# Patient Record
Sex: Female | Born: 1955
Health system: Southern US, Community
[De-identification: ages and names within clinical notes are randomized; demographics above are authoritative.]

## PROBLEM LIST (undated history)

## (undated) DIAGNOSIS — N950 Postmenopausal bleeding: Secondary | ICD-10-CM

## (undated) DIAGNOSIS — F329 Major depressive disorder, single episode, unspecified: Secondary | ICD-10-CM

## (undated) DIAGNOSIS — Z973 Presence of spectacles and contact lenses: Secondary | ICD-10-CM

## (undated) DIAGNOSIS — K759 Inflammatory liver disease, unspecified: Secondary | ICD-10-CM

## (undated) DIAGNOSIS — M1990 Unspecified osteoarthritis, unspecified site: Secondary | ICD-10-CM

## (undated) DIAGNOSIS — F419 Anxiety disorder, unspecified: Secondary | ICD-10-CM

## (undated) DIAGNOSIS — Z87820 Personal history of traumatic brain injury: Secondary | ICD-10-CM

## (undated) DIAGNOSIS — F32A Depression, unspecified: Secondary | ICD-10-CM

## (undated) DIAGNOSIS — D680 Von Willebrand's disease: Secondary | ICD-10-CM

## (undated) DIAGNOSIS — D684 Acquired coagulation factor deficiency: Secondary | ICD-10-CM

## (undated) DIAGNOSIS — R9389 Abnormal findings on diagnostic imaging of other specified body structures: Secondary | ICD-10-CM

## (undated) DIAGNOSIS — T8859XA Other complications of anesthesia, initial encounter: Secondary | ICD-10-CM

## (undated) DIAGNOSIS — R350 Frequency of micturition: Secondary | ICD-10-CM

## (undated) DIAGNOSIS — Z87898 Personal history of other specified conditions: Secondary | ICD-10-CM

## (undated) HISTORY — PX: HYSTEROSCOPY WITH D & C: SHX1775

## (undated) HISTORY — DX: Anxiety disorder, unspecified: F41.9

## (undated) HISTORY — PX: CERVICAL POLYPECTOMY: SHX88

## (undated) HISTORY — PX: COLONOSCOPY: SHX174

## (undated) HISTORY — PX: DILATION AND CURETTAGE OF UTERUS: SHX78

## (undated) HISTORY — PX: NASAL FRACTURE SURGERY: SHX718

## (undated) HISTORY — PX: DIAGNOSTIC LAPAROSCOPY: SUR761

## (undated) HISTORY — PX: ABDOMINAL HYSTERECTOMY: SHX81

## (undated) HISTORY — DX: Postmenopausal bleeding: N95.0

---

## 1998-09-04 ENCOUNTER — Other Ambulatory Visit: Admission: RE | Admit: 1998-09-04 | Discharge: 1998-09-04 | Payer: Self-pay | Admitting: *Deleted

## 1999-01-09 ENCOUNTER — Other Ambulatory Visit: Admission: RE | Admit: 1999-01-09 | Discharge: 1999-01-09 | Payer: Self-pay | Admitting: *Deleted

## 1999-12-30 ENCOUNTER — Encounter: Admission: RE | Admit: 1999-12-30 | Discharge: 1999-12-30 | Payer: Self-pay | Admitting: *Deleted

## 2000-02-29 ENCOUNTER — Ambulatory Visit (HOSPITAL_COMMUNITY): Admission: RE | Admit: 2000-02-29 | Discharge: 2000-02-29 | Payer: Self-pay | Admitting: *Deleted

## 2000-08-11 ENCOUNTER — Other Ambulatory Visit: Admission: RE | Admit: 2000-08-11 | Discharge: 2000-08-11 | Payer: Self-pay | Admitting: Obstetrics & Gynecology

## 2000-08-12 ENCOUNTER — Other Ambulatory Visit: Admission: RE | Admit: 2000-08-12 | Discharge: 2000-08-12 | Payer: Self-pay | Admitting: Obstetrics & Gynecology

## 2000-10-21 ENCOUNTER — Other Ambulatory Visit: Admission: RE | Admit: 2000-10-21 | Discharge: 2000-10-21 | Payer: Self-pay | Admitting: *Deleted

## 2001-05-01 ENCOUNTER — Other Ambulatory Visit: Admission: RE | Admit: 2001-05-01 | Discharge: 2001-05-01 | Payer: Self-pay | Admitting: Obstetrics and Gynecology

## 2001-10-17 ENCOUNTER — Other Ambulatory Visit: Admission: RE | Admit: 2001-10-17 | Discharge: 2001-10-17 | Payer: Self-pay | Admitting: *Deleted

## 2011-01-11 ENCOUNTER — Encounter: Payer: Self-pay | Admitting: Obstetrics and Gynecology

## 2011-07-30 DIAGNOSIS — H109 Unspecified conjunctivitis: Secondary | ICD-10-CM | POA: Insufficient documentation

## 2011-07-30 DIAGNOSIS — H5710 Ocular pain, unspecified eye: Secondary | ICD-10-CM | POA: Insufficient documentation

## 2011-07-31 ENCOUNTER — Encounter: Payer: Self-pay | Admitting: *Deleted

## 2011-07-31 ENCOUNTER — Emergency Department (HOSPITAL_BASED_OUTPATIENT_CLINIC_OR_DEPARTMENT_OTHER)
Admission: EM | Admit: 2011-07-31 | Discharge: 2011-07-31 | Disposition: A | Discharge status: Home / Self Care | Payer: 59 | Attending: Emergency Medicine | Admitting: Emergency Medicine

## 2011-07-31 DIAGNOSIS — H109 Unspecified conjunctivitis: Secondary | ICD-10-CM

## 2011-07-31 HISTORY — DX: Von Willebrand's disease: D68.0

## 2011-07-31 HISTORY — DX: Depression, unspecified: F32.A

## 2011-07-31 HISTORY — DX: Major depressive disorder, single episode, unspecified: F32.9

## 2011-07-31 MED ORDER — TOBRAMYCIN 0.3 % OP SOLN
2.0000 [drp] | Freq: Four times a day (QID) | OPHTHALMIC | Status: DC
Start: 2011-07-31 — End: 2011-07-31
  Filled 2011-07-31: qty 5

## 2011-07-31 MED ORDER — TOBRAMYCIN 0.3 % OP SOLN: 2.0000 [drp] | Freq: Four times a day (QID) | OPHTHALMIC | Status: AC

## 2011-07-31 NOTE — ED Provider Notes (Signed)
History     CSN: [id number] Arrival date & time: 07/31/2011 12:42 AM  Chief Complaint  Patient presents with  . Eye Pain   HPI Patient awakened morning of 07/30/2011 with bilateral eye redness and clear drainage from both eyes and vague feeling of discomfort in both eyes no change in vision no other complaint no treatment prior to coming here. Nothing makes symptoms better or worse  Past Medical History  Diagnosis Date  . Hemophilia, vascular   . Depression   . Von Willebrand's disease    Factor VIII deficiency Past Surgical History  Procedure Date  . Nasal fracture surgery   . Abdominal hysterectomy     No family history on file.  History  Substance Use Topics  . Smoking status: Never Smoker   . Smokeless tobacco: Not on file  . Alcohol Use: No    OB History    Grav Para Term Preterm Abortions TAB SAB Ect Mult Living                  Review of Systems  Constitutional: Negative.   HENT: Negative.   Eyes: Positive for discharge and redness. Negative for visual disturbance.  Neurological: Negative.   Psychiatric/Behavioral: Negative.     Physical Exam  BP 136/77  Pulse 70  Temp(Src) 97.9 F (36.6 C) (Oral)  Resp 18  Physical Exam  Constitutional: She appears well-developed and well-nourished.  HENT:  Head: Normocephalic and atraumatic.  Eyes: Pupils are equal, round, and reactive to light.       Sub-conjunctival injection bilaterally bilateral lids slightly swollen and reddened  Neck: Neck supple. No tracheal deviation present. No thyromegaly present.  Cardiovascular: Normal rate.   No murmur heard. Pulmonary/Chest: Effort normal.  Abdominal: She exhibits no distension.  Musculoskeletal: Normal range of motion. She exhibits no edema and no tenderness.  Neurological: She is alert. Coordination normal.  Skin: Skin is warm and dry. No rash noted.  Psychiatric: She has a normal mood and affect.    ED Course  Procedures  MDM    Assessment  conjunctivitis plan prescription for tobramycin eye drops. Followup [facility] if not improved 2 days  Doug Sou, MD 07/31/11 0147

## 2011-07-31 NOTE — ED Notes (Signed)
Pt awoke on Friday am with bilat eye redness and irritation. Pt reports no relief with OTC eye drops.

## 2012-07-21 ENCOUNTER — Other Ambulatory Visit: Payer: Self-pay | Admitting: Gastroenterology

## 2012-07-21 DIAGNOSIS — R198 Other specified symptoms and signs involving the digestive system and abdomen: Secondary | ICD-10-CM

## 2012-08-07 ENCOUNTER — Ambulatory Visit
Admission: RE | Admit: 2012-08-07 | Discharge: 2012-08-07 | Disposition: A | Discharge status: Home / Self Care | Payer: 59 | Source: Ambulatory Visit | Attending: Gastroenterology | Admitting: Gastroenterology

## 2012-08-07 DIAGNOSIS — R198 Other specified symptoms and signs involving the digestive system and abdomen: Secondary | ICD-10-CM

## 2015-03-17 ENCOUNTER — Emergency Department (HOSPITAL_COMMUNITY)
Admission: EM | Admit: 2015-03-17 | Discharge: 2015-03-17 | Disposition: A | Discharge status: Home / Self Care | Payer: 59 | Attending: Emergency Medicine | Admitting: Emergency Medicine

## 2015-03-17 ENCOUNTER — Encounter (HOSPITAL_COMMUNITY): Payer: Self-pay | Admitting: Emergency Medicine

## 2015-03-17 ENCOUNTER — Emergency Department (HOSPITAL_COMMUNITY): Payer: 59

## 2015-03-17 DIAGNOSIS — F329 Major depressive disorder, single episode, unspecified: Secondary | ICD-10-CM | POA: Diagnosis not present

## 2015-03-17 DIAGNOSIS — R52 Pain, unspecified: Secondary | ICD-10-CM

## 2015-03-17 DIAGNOSIS — Z79899 Other long term (current) drug therapy: Secondary | ICD-10-CM | POA: Insufficient documentation

## 2015-03-17 DIAGNOSIS — Z862 Personal history of diseases of the blood and blood-forming organs and certain disorders involving the immune mechanism: Secondary | ICD-10-CM | POA: Insufficient documentation

## 2015-03-17 DIAGNOSIS — R079 Chest pain, unspecified: Secondary | ICD-10-CM | POA: Diagnosis not present

## 2015-03-17 DIAGNOSIS — R109 Unspecified abdominal pain: Secondary | ICD-10-CM | POA: Insufficient documentation

## 2015-03-17 DIAGNOSIS — Z90710 Acquired absence of both cervix and uterus: Secondary | ICD-10-CM | POA: Diagnosis not present

## 2015-03-17 DIAGNOSIS — R0789 Other chest pain: Secondary | ICD-10-CM

## 2015-03-17 LAB — CBC WITH DIFFERENTIAL/PLATELET
Basophils Absolute: 0 10*3/uL (ref 0.0–0.1)
Basophils Relative: 0 % (ref 0–1)
Eosinophils Absolute: 0.2 10*3/uL (ref 0.0–0.7)
Eosinophils Relative: 3 % (ref 0–5)
HCT: 37.4 % (ref 36.0–46.0)
Hemoglobin: 11.8 g/dL — ABNORMAL LOW (ref 12.0–15.0)
Lymphocytes Relative: 22 % (ref 12–46)
Lymphs Abs: 1.7 10*3/uL (ref 0.7–4.0)
MCH: 26.1 pg (ref 26.0–34.0)
MCHC: 31.6 g/dL (ref 30.0–36.0)
MCV: 82.7 fL (ref 78.0–100.0)
Monocytes Absolute: 0.7 10*3/uL (ref 0.1–1.0)
Monocytes Relative: 9 % (ref 3–12)
Neutro Abs: 5.2 10*3/uL (ref 1.7–7.7)
Neutrophils Relative %: 66 % (ref 43–77)
Platelets: 360 10*3/uL (ref 150–400)
RBC: 4.52 MIL/uL (ref 3.87–5.11)
RDW: 13.9 % (ref 11.5–15.5)
WBC: 7.8 10*3/uL (ref 4.0–10.5)

## 2015-03-17 LAB — COMPREHENSIVE METABOLIC PANEL
ALT: 25 U/L (ref 0–35)
AST: 26 U/L (ref 0–37)
Albumin: 4.2 g/dL (ref 3.5–5.2)
Alkaline Phosphatase: 85 U/L (ref 39–117)
Anion gap: 9 (ref 5–15)
BUN: 25 mg/dL — ABNORMAL HIGH (ref 6–23)
CO2: 26 mmol/L (ref 19–32)
Calcium: 9.4 mg/dL (ref 8.4–10.5)
Chloride: 104 mmol/L (ref 96–112)
Creatinine, Ser: 0.56 mg/dL (ref 0.50–1.10)
GFR calc Af Amer: >90 mL/min (ref >90)
GFR calc non Af Amer: >90 mL/min (ref >90)
Glucose, Bld: 98 mg/dL (ref 70–99)
Potassium: 3.9 mmol/L (ref 3.5–5.1)
Sodium: 139 mmol/L (ref 135–145)
Total Bilirubin: 0.4 mg/dL (ref 0.3–1.2)
Total Protein: 7.6 g/dL (ref 6.0–8.3)

## 2015-03-17 LAB — LIPASE, BLOOD: Lipase: 22 U/L (ref 11–59)

## 2015-03-17 LAB — I-STAT TROPONIN, ED: Troponin i, poc: 0.02 ng/mL (ref 0.00–0.08)

## 2015-03-17 MED ORDER — SODIUM CHLORIDE 0.9 % IV BOLUS (SEPSIS)
1000.0000 mL | Freq: Once | INTRAVENOUS | Status: AC
  Administered 2015-03-17: 1000 mL via INTRAVENOUS

## 2015-03-17 NOTE — ED Notes (Signed)
Pt was on sofa not doing anything when she had an episode of diaphoresis with severe jaw pain and this was followed by multiple episodes of vomiting.  Pt then had difficulty sleeping and saw her PCP today and was told to come be evaluated to make sure she did not have a MI.

## 2015-03-17 NOTE — Discharge Instructions (Signed)
Follow up with [facility] in 1-2 weeks.  Return if needed

## 2015-03-17 NOTE — ED Notes (Signed)
Patient in xray I will perform labs when patient return.

## 2015-03-17 NOTE — ED Notes (Signed)
Pt states doctors office told pt to come over here because she mat have had a heart attack Saturday night. Pt now c/o upper abd pain and states she is dehydrated.

## 2015-03-17 NOTE — ED Provider Notes (Signed)
CSN: [id number]     Arrival date & time 03/17/15  1141 History   First MD Initiated Contact with Patient 03/17/15 1208     Chief Complaint  Patient presents with  . Dehydration  . Abdominal Pain     (Consider location/radiation/quality/duration/timing/severity/associated sxs/prior Treatment) Patient is a 59 y.o. female presenting with chest pain. The history is provided by the patient (the pt states she had 2 days ago neck pain and sob with sweating).  Chest Pain Chest pain location: neck pain. Pain quality: aching   Pain radiates to:  L jaw Pain radiates to the back: no   Pain severity:  Severe Onset quality:  Sudden Timing:  Rare Chronicity:  New Associated symptoms: no abdominal pain, no back pain, no cough, no fatigue and no headache     Past Medical History  Diagnosis Date  . Hemophilia, vascular   . Depression   . Von Willebrand's disease    Past Surgical History  Procedure Laterality Date  . Nasal fracture surgery    . Abdominal hysterectomy     No family history on file. History  Substance Use Topics  . Smoking status: Never Smoker   . Smokeless tobacco: Not on file  . Alcohol Use: No   OB History    No data available     Review of Systems  Constitutional: Negative for appetite change and fatigue.  HENT: Negative for congestion, ear discharge and sinus pressure.   Eyes: Negative for discharge.  Respiratory: Negative for cough.   Cardiovascular: Positive for chest pain.  Gastrointestinal: Negative for abdominal pain and diarrhea.  Genitourinary: Negative for frequency and hematuria.  Musculoskeletal: Negative for back pain.  Skin: Negative for rash.  Neurological: Negative for seizures and headaches.  Psychiatric/Behavioral: Negative for hallucinations.      Allergies  Septra; Adhesive; and Tamiflu  Home Medications   Prior to Admission medications   Medication Sig Start Date End Date Taking? Authorizing Provider  acetaminophen (TYLENOL) 500  MG tablet Take 500-1,000 mg by mouth every 6 (six) hours as needed for mild pain, moderate pain, fever or headache.   Yes Historical Provider, MD  calcium-vitamin D (OSCAL WITH D) 500-200 MG-UNIT per tablet Take 1 tablet by mouth 2 (two) times daily.   Yes Historical Provider, MD  Triprolidine-Pseudoephedrine (ANTIHISTAMINE PO) Take 1 tablet by mouth daily as needed (for allergies).   Yes Historical Provider, MD  venlafaxine XR (EFFEXOR-XR) 37.5 MG 24 hr capsule Take 37.5 mg by mouth at bedtime.   Yes Historical Provider, MD   BP 132/68 mmHg  Pulse 68  Temp(Src) 98.8 F (37.1 C) (Oral)  Resp 18  SpO2 98% Physical Exam  Constitutional: She is oriented to person, place, and time. She appears well-developed.  HENT:  Head: Normocephalic.  Eyes: Conjunctivae and EOM are normal. No scleral icterus.  Neck: Neck supple. No thyromegaly present.  Cardiovascular: Normal rate and regular rhythm.  Exam reveals no gallop and no friction rub.   No murmur heard. Pulmonary/Chest: No stridor. She has no wheezes. She has no rales. She exhibits no tenderness.  Abdominal: She exhibits no distension. There is no tenderness. There is no rebound.  Musculoskeletal: Normal range of motion. She exhibits no edema.  Lymphadenopathy:    She has no cervical adenopathy.  Neurological: She is oriented to person, place, and time. She exhibits normal muscle tone. Coordination normal.  Skin: No rash noted. No erythema.  Psychiatric: She has a normal mood and affect. Her behavior is normal.  ED Course  Procedures (including critical care time) Labs Review Labs Reviewed  CBC WITH DIFFERENTIAL/PLATELET - Abnormal; Notable for the following:    Hemoglobin 11.8 (*)    All other components within normal limits  COMPREHENSIVE METABOLIC PANEL - Abnormal; Notable for the following:    BUN 25 (*)    All other components within normal limits  LIPASE, BLOOD  I-STAT TROPOININ, ED    Imaging Review Dg Abd Acute  W/chest  03/17/2015   CLINICAL DATA:  Upper abdominal pain beginning 2 days ago with associated vomiting and fevers.  EXAM: ACUTE ABDOMEN SERIES (ABDOMEN 2 VIEW & CHEST 1 VIEW)  COMPARISON:  CT virtual colonoscopy 08/07/2012  FINDINGS: The cardiomediastinal silhouette is within normal limits. The lungs are well inflated and clear. No pleural effusion or pneumothorax is identified.  There is no evidence of intraperitoneal free air. No bowel air-fluid levels are seen. Gas is present in loops of nondilated small and large bowel without evidence of obstruction. There is a small amount of stool throughout the colon. No abnormal calcification is seen. No acute osseous abnormality is identified.  IMPRESSION: Negative abdominal radiographs.  No acute cardiopulmonary disease.   Electronically Signed   By: Sebastian Ache   On: 03/17/2015 13:05     EKG Interpretation   Date/Time:  Monday March 17 2015 11:54:25 EDT Ventricular Rate:  80 PR Interval:  128 QRS Duration: 85 QT Interval:  372 QTC Calculation: 429 R Axis:   61 Text Interpretation:  Sinus rhythm Confirmed by Palmyra Rogacki  MD, Ever Gustafson [phone])  on 03/17/2015 12:17:53 PM      MDM   Final diagnoses:  Pain  Chest pain of uncertain etiology    Chest pain 2 days ago,,  Nl studies, will refer to cards for possible stress test    Bethann Berkshire, MD 03/17/15 1454

## 2015-04-21 ENCOUNTER — Ambulatory Visit: Payer: 59 | Admitting: Internal Medicine

## 2015-05-21 ENCOUNTER — Encounter: Payer: Self-pay | Admitting: Internal Medicine

## 2015-05-21 ENCOUNTER — Ambulatory Visit (INDEPENDENT_AMBULATORY_CARE_PROVIDER_SITE_OTHER): Payer: 59 | Admitting: Internal Medicine

## 2015-05-21 VITALS — BP 124/66 | HR 72 | Ht 64.0 in | Wt 205.8 lb

## 2015-05-21 DIAGNOSIS — R079 Chest pain, unspecified: Secondary | ICD-10-CM

## 2015-05-21 DIAGNOSIS — Z8249 Family history of ischemic heart disease and other diseases of the circulatory system: Secondary | ICD-10-CM | POA: Insufficient documentation

## 2015-05-21 NOTE — Progress Notes (Signed)
OFFICE NOTE  Chief Complaint:  ER follow-up, chest pain  Primary Care Physician: Darrow BussingKOIRALA,DIBAS, MD  HPI:  Mary CurryJeanne Juarez is a pleasant 59 year old female who presents for follow-up from recent hospitalization the ER. She was seen there for what was thought to be chest pain although she denies any chest pain. Evette CristalShin fact presented with jaw pain and was vomiting. This apparently was just after being diagnosed with the flu and she was placed on Tamiflu. She felt that she even threw up the medicine. She's not had any further jaw pain since that episode, nor any chest pain or worsening fatigue. There is a strong family history of coronary disease in her brother and parents. She does not have any diagnosis of dyslipidemia or hypertension and is nondiabetic. She does have a significant psychiatric history including depression, catatonia, and history of anorexia. She has von Willebrand's disease as well.  PMHx:  Past Medical History  Diagnosis Date  . Hemophilia, vascular   . Depression   . Von Willebrand's disease     Past Surgical History  Procedure Laterality Date  . Nasal fracture surgery    . Abdominal hysterectomy      FAMHx:  Family History  Problem Relation Age of Onset  . Stroke Mother   . Heart disease Mother     pacemaker  . Heart attack Father   . Kidney failure Father   . Heart disease Maternal Grandmother     pacemaker  . Heart attack Maternal Grandfather   . Sudden death Maternal Grandfather   . Cancer Paternal Grandmother     oral   . Heart attack Paternal Grandfather   . Sudden death Paternal Grandfather   . Heart disease Brother     SOCHx:   reports that she has never smoked. She does not have any smokeless tobacco history on file. She reports that she does not drink alcohol or use illicit drugs.  ALLERGIES:  Allergies  Allergen Reactions  . Septra [Sulfamethoxazole-Trimethoprim] Other (See Comments)    Severe migraine   . Adhesive [Tape] Rash  .  Latex Rash  . Tamiflu [Oseltamivir] Nausea And Vomiting    ROS: A comprehensive review of systems was negative except for: Cardiovascular: positive for chest pain Musculoskeletal: positive for myalgias  HOME MEDS: Current Outpatient Prescriptions  Medication Sig Dispense Refill  . acetaminophen (TYLENOL) 500 MG tablet Take 500-1,000 mg by mouth every 6 (six) hours as needed for mild pain, moderate pain, fever or headache.    . calcium-vitamin D (OSCAL WITH D) 500-200 MG-UNIT per tablet Take 1 tablet by mouth 2 (two) times daily.    . clobetasol ointment (TEMOVATE) 0.05 % Apply topically. Use as directed    . Triprolidine-Pseudoephedrine (ANTIHISTAMINE PO) Take 1 tablet by mouth daily as needed (for allergies).    . venlafaxine XR (EFFEXOR-XR) 37.5 MG 24 hr capsule Take 37.5 mg by mouth at bedtime.     No current facility-administered medications for this visit.    LABS/IMAGING: No results found for this or any previous visit (from the past 48 hour(s)). No results found.  WEIGHTS: Wt Readings from Last 3 Encounters:  05/21/15 205 lb 12.8 oz (93.35 kg)    VITALS: BP 124/66 mmHg  Pulse 72  Ht 5\' 4"  (1.626 m)  Wt 205 lb 12.8 oz (93.35 kg)  BMI 35.31 kg/m2  LMP   EXAM: General appearance: alert and no distress Neck: no carotid bruit and no JVD Lungs: clear to auscultation bilaterally Heart: regular  rate and rhythm, S1, S2 normal, no murmur, click, rub or gallop Abdomen: soft, non-tender; bowel sounds normal; no masses,  no organomegaly Extremities: extremities normal, atraumatic, no cyanosis or edema Pulses: 2+ and symmetric Skin: Skin color, texture, turgor normal. No rashes or lesions Neurologic: Grossly normal Psych: Normal  EKG: Personally reviewed ER EKG which shows sinus rhythm, no ischemia  ASSESSMENT: 1. Atypical chest pain 2. Depression 3. Strong family history of coronary disease  PLAN: 1.   Mary Juarez is presenting for follow-up of what is described  as possible atypical chest pain although she vehemently denies any chest discomfort, she did report jaw pain which was very unusual. She denied any burning or reflux symptoms. She is known to have a hiatus hernia. Her workup in the ER was negative for ischemia and troponin was negative. This sounds like a typical pain for a cardiac etiology. That being said, given her family history and increased risk, I would be reasonable consider an exercise treadmill stress test. If negative, no further cardiac workup is necessary. We will request a lipid profile. It may be reasonable to consider treatment.  I'll contact her with results of her general stress test. Thanks again for the kind referral.  Chrystie Nose, MD, [facility] Attending Cardiologist [facility]  Chrystie Nose 05/21/2015, 3:21 PM

## 2015-05-21 NOTE — Patient Instructions (Addendum)
Your physician has requested that you have an exercise tolerance test. For further information please visit [email or web address]. Please also follow instruction sheet, as given.  Your physician recommends that you schedule a follow-up appointment as needed.   

## 2015-05-22 ENCOUNTER — Encounter: Payer: Self-pay | Admitting: Internal Medicine

## 2015-06-20 ENCOUNTER — Telehealth (HOSPITAL_COMMUNITY): Payer: Self-pay

## 2015-06-20 NOTE — Telephone Encounter (Signed)
Encounter complete. 

## 2015-06-25 ENCOUNTER — Ambulatory Visit (HOSPITAL_COMMUNITY)
Admission: RE | Admit: 2015-06-25 | Discharge: 2015-06-25 | Disposition: A | Discharge status: Home / Self Care | Payer: 59 | Source: Ambulatory Visit | Attending: Cardiovascular Disease | Admitting: Cardiovascular Disease

## 2015-06-25 DIAGNOSIS — R079 Chest pain, unspecified: Secondary | ICD-10-CM | POA: Diagnosis present

## 2015-06-25 DIAGNOSIS — Z8249 Family history of ischemic heart disease and other diseases of the circulatory system: Secondary | ICD-10-CM | POA: Diagnosis not present

## 2015-06-25 LAB — EXERCISE TOLERANCE TEST
Estimated workload: 10.1 METS
Exercise duration (min): 8 min
Exercise duration (sec): 6 s
MPHR: 160 {beats}/min
Peak HR: 142 {beats}/min
Percent HR: 88 %
RPE: 15
Rest HR: 80 {beats}/min

## 2015-06-30 ENCOUNTER — Telehealth: Payer: Self-pay | Admitting: Internal Medicine

## 2015-06-30 NOTE — Telephone Encounter (Signed)
Pt. Informed about her stress test 

## 2015-06-30 NOTE — Telephone Encounter (Signed)
Pt would like her stress test results from last week please.You can leave detailed message if she is not there.

## 2015-10-30 ENCOUNTER — Other Ambulatory Visit: Payer: Self-pay

## 2015-10-30 DIAGNOSIS — Z1231 Encounter for screening mammogram for malignant neoplasm of breast: Secondary | ICD-10-CM

## 2015-11-20 ENCOUNTER — Ambulatory Visit: Payer: 59

## 2016-07-01 ENCOUNTER — Other Ambulatory Visit: Payer: Self-pay | Admitting: Family Medicine

## 2016-07-01 ENCOUNTER — Other Ambulatory Visit: Payer: Self-pay | Admitting: Obstetrics and Gynecology

## 2016-07-01 DIAGNOSIS — Z1231 Encounter for screening mammogram for malignant neoplasm of breast: Secondary | ICD-10-CM

## 2016-07-06 ENCOUNTER — Ambulatory Visit
Admission: RE | Admit: 2016-07-06 | Discharge: 2016-07-06 | Disposition: A | Discharge status: Home / Self Care | Payer: BLUE CROSS/BLUE SHIELD | Source: Ambulatory Visit | Attending: Family Medicine | Admitting: Family Medicine

## 2016-07-06 DIAGNOSIS — Z1231 Encounter for screening mammogram for malignant neoplasm of breast: Secondary | ICD-10-CM

## 2018-12-26 ENCOUNTER — Other Ambulatory Visit: Payer: Self-pay | Admitting: Family Medicine

## 2018-12-26 DIAGNOSIS — Z8342 Family history of familial hypercholesterolemia: Secondary | ICD-10-CM | POA: Diagnosis not present

## 2018-12-26 DIAGNOSIS — Z1231 Encounter for screening mammogram for malignant neoplasm of breast: Secondary | ICD-10-CM

## 2018-12-26 DIAGNOSIS — Z13220 Encounter for screening for lipoid disorders: Secondary | ICD-10-CM | POA: Diagnosis not present

## 2018-12-26 DIAGNOSIS — Z0000 Encounter for general adult medical examination without abnormal findings: Secondary | ICD-10-CM | POA: Diagnosis not present

## 2019-01-04 DIAGNOSIS — B029 Zoster without complications: Secondary | ICD-10-CM | POA: Diagnosis not present

## 2019-01-04 DIAGNOSIS — G518 Other disorders of facial nerve: Secondary | ICD-10-CM | POA: Diagnosis not present

## 2019-01-05 DIAGNOSIS — B0239 Other herpes zoster eye disease: Secondary | ICD-10-CM | POA: Diagnosis not present

## 2019-01-09 DIAGNOSIS — B029 Zoster without complications: Secondary | ICD-10-CM | POA: Diagnosis not present

## 2019-01-25 ENCOUNTER — Ambulatory Visit
Admission: RE | Admit: 2019-01-25 | Discharge: 2019-01-25 | Disposition: A | Discharge status: Home / Self Care | Payer: 59 | Source: Ambulatory Visit | Attending: Family Medicine | Admitting: Family Medicine

## 2019-01-25 DIAGNOSIS — Z1231 Encounter for screening mammogram for malignant neoplasm of breast: Secondary | ICD-10-CM

## 2019-02-20 ENCOUNTER — Other Ambulatory Visit (HOSPITAL_COMMUNITY)
Admission: RE | Admit: 2019-02-20 | Discharge: 2019-02-20 | Disposition: A | Discharge status: Home / Self Care | Payer: 59 | Source: Ambulatory Visit | Attending: Obstetrics and Gynecology | Admitting: Obstetrics and Gynecology

## 2019-02-20 ENCOUNTER — Other Ambulatory Visit: Payer: Self-pay | Admitting: Obstetrics and Gynecology

## 2019-02-20 DIAGNOSIS — Z124 Encounter for screening for malignant neoplasm of cervix: Secondary | ICD-10-CM | POA: Insufficient documentation

## 2019-02-20 DIAGNOSIS — Z01411 Encounter for gynecological examination (general) (routine) with abnormal findings: Secondary | ICD-10-CM | POA: Diagnosis not present

## 2019-02-21 ENCOUNTER — Other Ambulatory Visit: Payer: Self-pay | Admitting: Obstetrics and Gynecology

## 2019-02-21 DIAGNOSIS — M8580 Other specified disorders of bone density and structure, unspecified site: Secondary | ICD-10-CM

## 2019-02-22 LAB — CYTOLOGY - PAP
Diagnosis: NEGATIVE
HPV: NOT DETECTED

## 2019-05-28 ENCOUNTER — Ambulatory Visit
Admission: RE | Admit: 2019-05-28 | Discharge: 2019-05-28 | Disposition: A | Discharge status: Home / Self Care | Payer: 59 | Source: Ambulatory Visit | Attending: Obstetrics and Gynecology | Admitting: Obstetrics and Gynecology

## 2019-05-28 ENCOUNTER — Other Ambulatory Visit: Payer: Self-pay

## 2019-05-28 DIAGNOSIS — M8580 Other specified disorders of bone density and structure, unspecified site: Secondary | ICD-10-CM

## 2019-06-19 IMAGING — MG DIGITAL SCREENING BILATERAL MAMMOGRAM WITH TOMO AND CAD
8 series · 9 of 24 positions shown · non-contrast
Comparison: Previous exam(s).

CLINICAL DATA: Screening.

EXAM:
DIGITAL SCREENING BILATERAL MAMMOGRAM WITH TOMO AND CAD

[L MLO synth-2D]
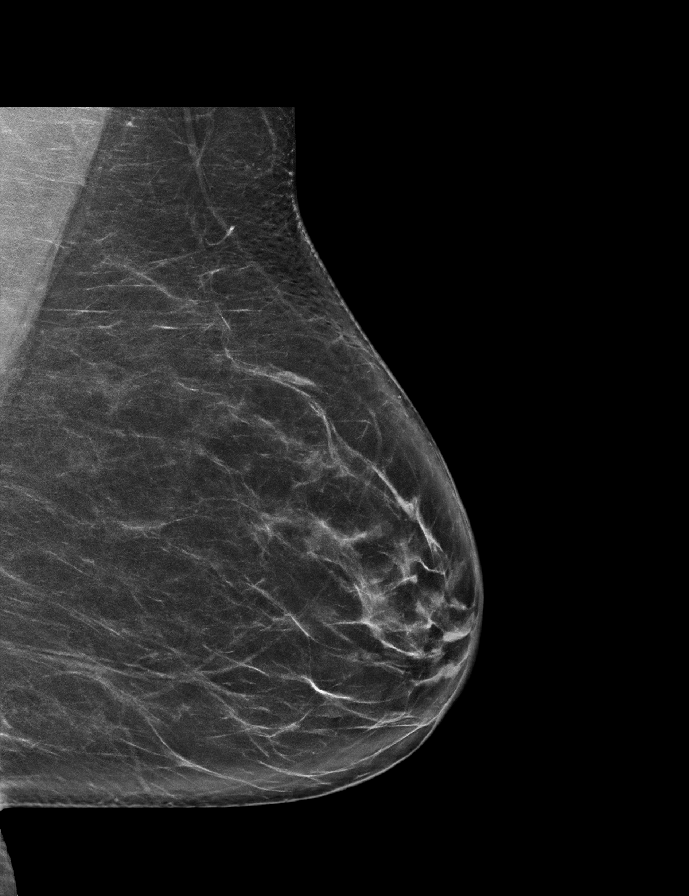

[L CC synth-2D]
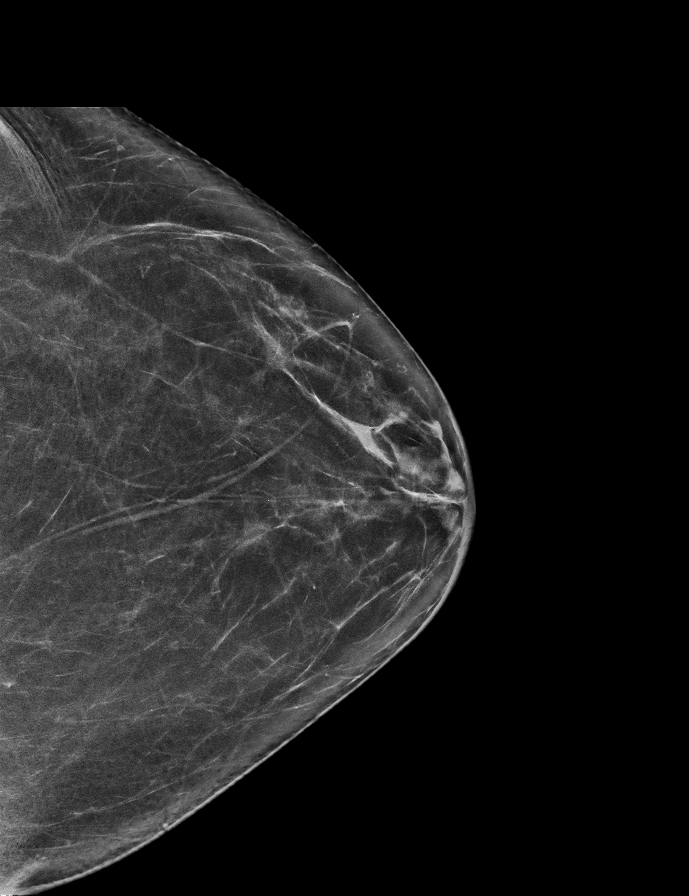

[R MLO synth-2D]
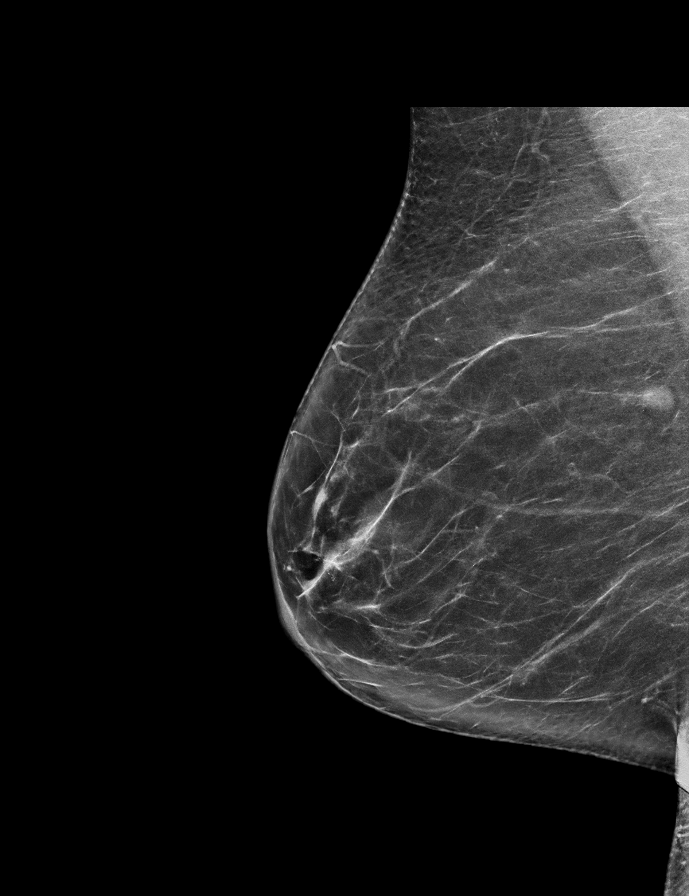

[R CC synth-2D]
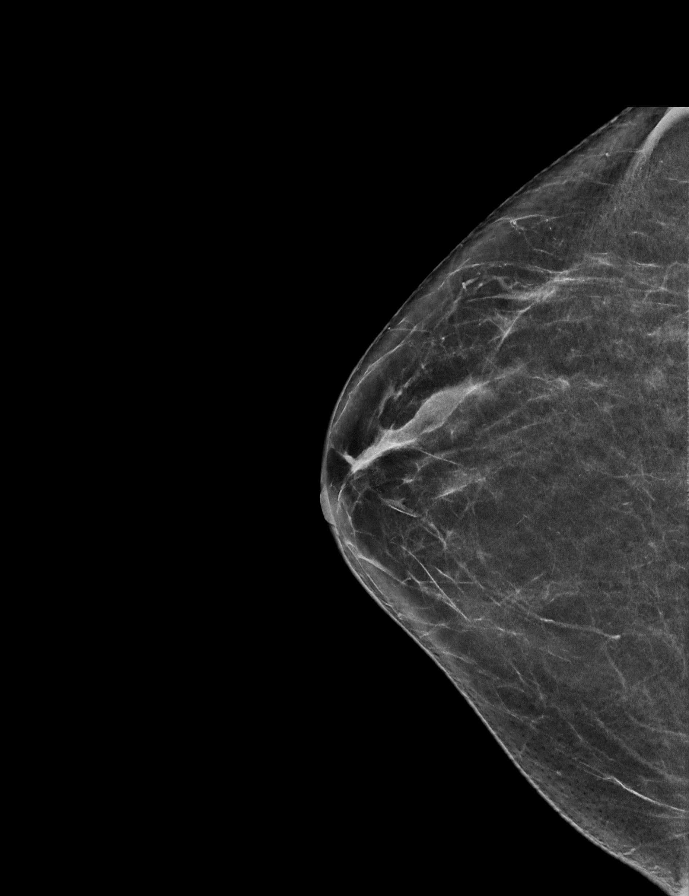

[L CC tomo · 2 of 66 frames shown]
[frame 22/66]
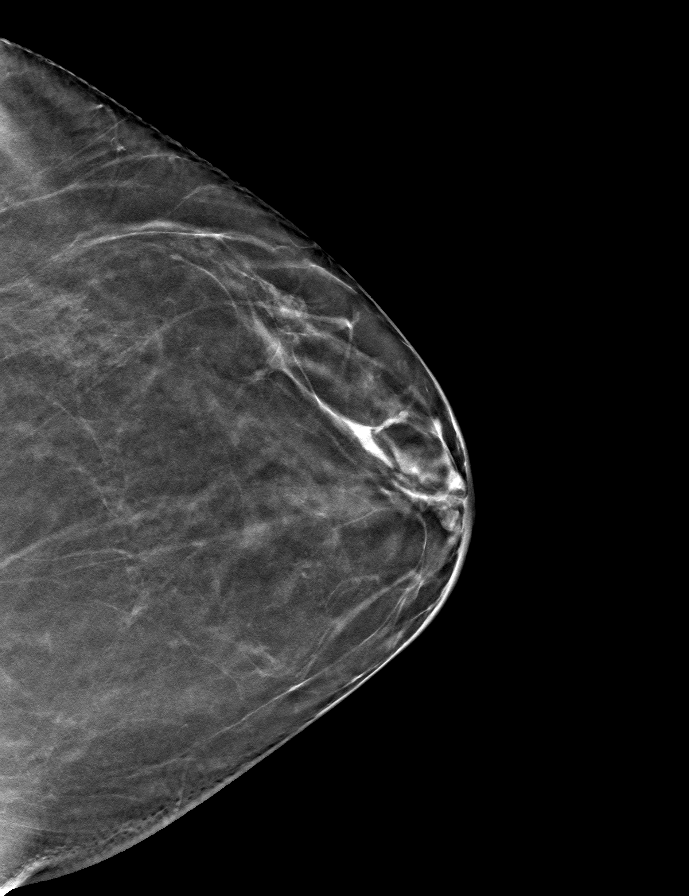
[frame 33/66]
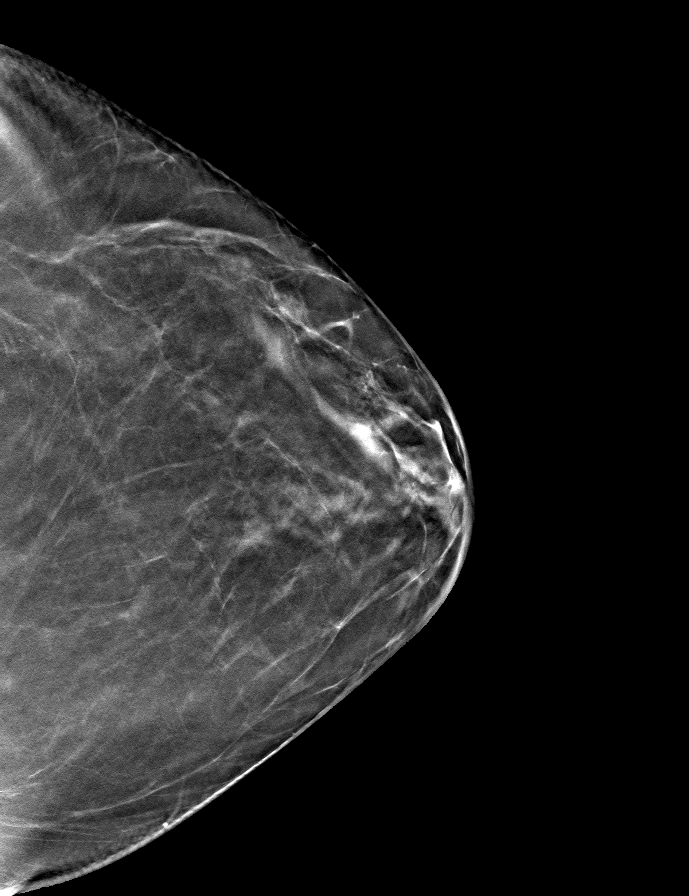

[R MLO tomo · tomo slice 37/73.0]
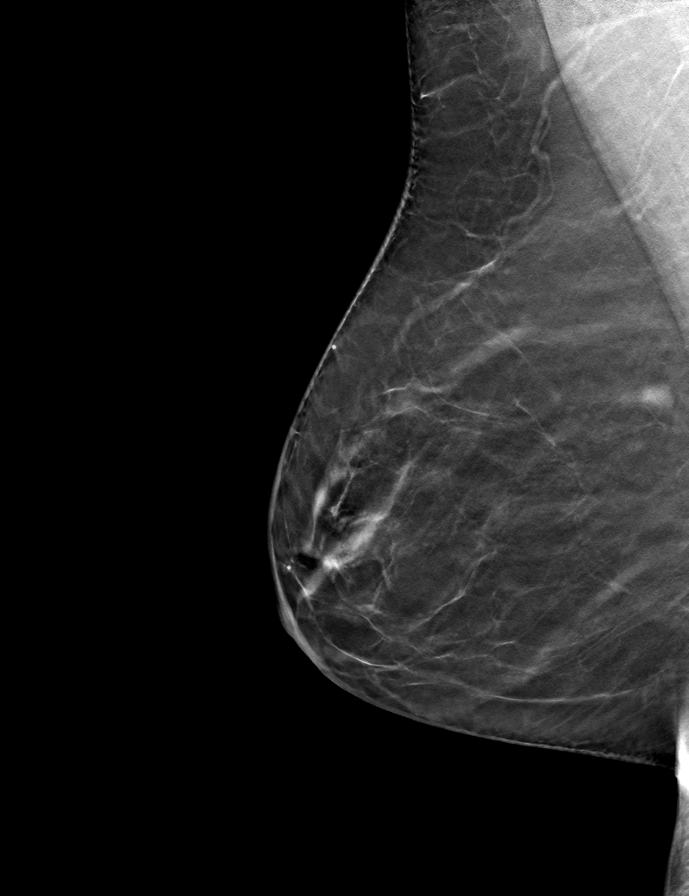

[L MLO tomo · tomo slice 37/73.0]
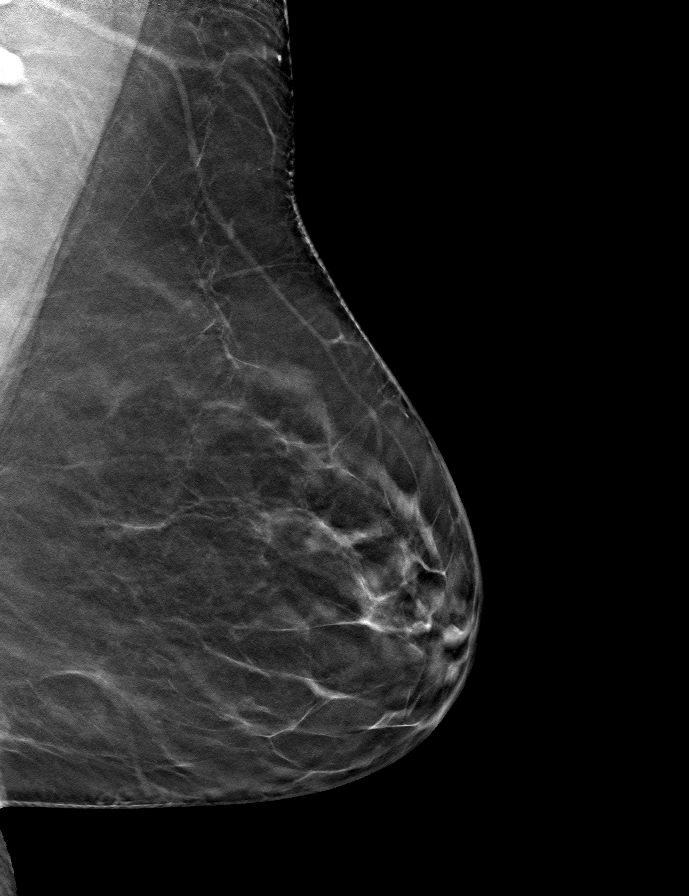

[R CC tomo · tomo slice 33/64.0]
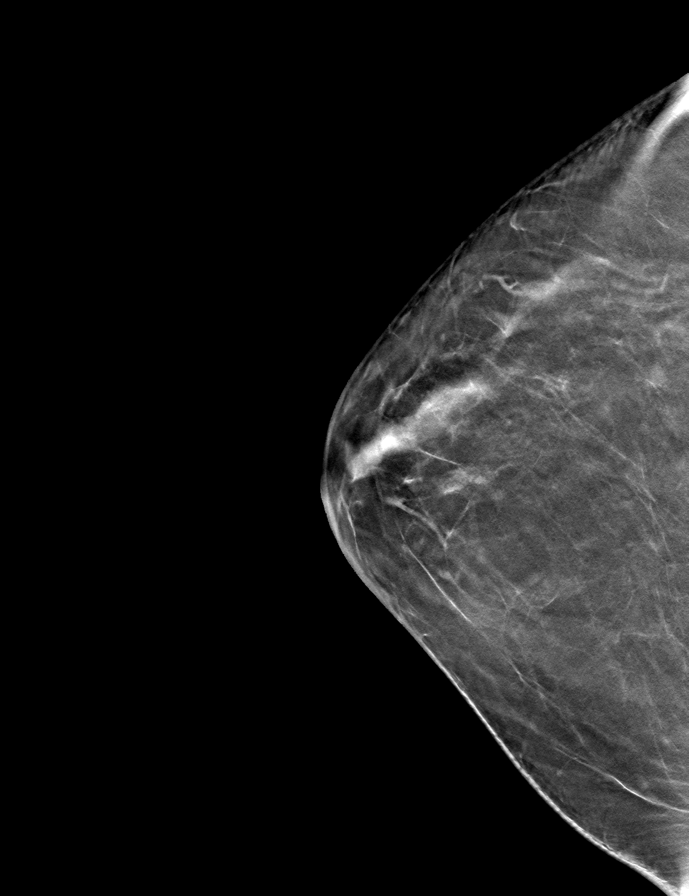

[9 of 24 positions shown; findings below may reference images not displayed]

ACR Breast Density Category b: There are scattered areas of
fibroglandular density.
FINDINGS: There are no findings suspicious for malignancy. Images were
processed with CAD.
IMPRESSION: No mammographic evidence of malignancy. A result letter of this
screening mammogram will be mailed directly to the patient.

RECOMMENDATION:
Screening mammogram in one year. (Code:[id number])

BI-RADS CATEGORY  1: Negative.

## 2020-03-10 ENCOUNTER — Telehealth: Payer: Self-pay | Admitting: Emergency Medicine

## 2020-03-10 NOTE — Telephone Encounter (Signed)
Received call from [place] who is currently not w/in [facility] system.  She expressed concerns regarding receiving the Covid-19 shot due to her hx of two blood disorders, states she would like to receive the [employer] injection as it is a smaller needle & only one injection.  She has attempted to contact her hematologist's office Dr. Mathis Bud but states she was only told that she couldn't receive the shot there.  [facility] triage RN called their office & left VM requesting that [place] be contacted regarding her hematologist's opinion on her receiving the Covid-19 J&J vaccine and, if she does feel that she needs to get this shot specifically, to write a letter/order for her to receive it at an appropriate innoculation location.  Pt aware to expect call from [place]'s office to f/u on question, denies any further questions/concerns at this time.

## 2021-02-06 ENCOUNTER — Telehealth: Payer: Self-pay | Admitting: *Deleted

## 2021-02-06 NOTE — Telephone Encounter (Signed)
Error

## 2021-02-06 NOTE — Telephone Encounter (Addendum)
Called the patient back and scheduled a new patient appt on 3/4 at 9:45 am with Dr Pricilla Holm. Patient given the address and phone number for the clinic. Patient given the policy for mask and visitors. Patient stated that the referring gave the patient ativan before her last two appts for anxiety and pain. Patient requesting medication.

## 2021-02-06 NOTE — Telephone Encounter (Signed)
Patient called and left a message asking for a new patient appt. Returned the patient's call and left a message to call the office back.

## 2021-02-09 NOTE — Telephone Encounter (Signed)
Called and left the patient a message stating "per Dr Pricilla Holm, since we have not seen her yet has a patient we can not prescribe any medications. Dr Pricilla Holm recommends calling your OB/GYN office to see if they will prescribe some medication."

## 2021-02-19 ENCOUNTER — Encounter: Payer: Self-pay | Admitting: Gynecologic Oncology

## 2021-02-20 ENCOUNTER — Other Ambulatory Visit: Payer: Self-pay

## 2021-02-20 ENCOUNTER — Inpatient Hospital Stay: Payer: No Typology Code available for payment source | Attending: Gynecologic Oncology | Admitting: Gynecologic Oncology

## 2021-02-20 ENCOUNTER — Encounter (HOSPITAL_BASED_OUTPATIENT_CLINIC_OR_DEPARTMENT_OTHER): Payer: Self-pay | Admitting: Gynecologic Oncology

## 2021-02-20 ENCOUNTER — Other Ambulatory Visit: Payer: Self-pay | Admitting: Gynecologic Oncology

## 2021-02-20 ENCOUNTER — Encounter: Payer: Self-pay | Admitting: Gynecologic Oncology

## 2021-02-20 VITALS — BP 147/67 | HR 70 | Temp 96.6°F | Resp 18 | Ht 64.0 in | Wt 150.0 lb

## 2021-02-20 DIAGNOSIS — F419 Anxiety disorder, unspecified: Secondary | ICD-10-CM | POA: Diagnosis not present

## 2021-02-20 DIAGNOSIS — R6881 Early satiety: Secondary | ICD-10-CM | POA: Insufficient documentation

## 2021-02-20 DIAGNOSIS — R9389 Abnormal findings on diagnostic imaging of other specified body structures: Secondary | ICD-10-CM | POA: Insufficient documentation

## 2021-02-20 DIAGNOSIS — Z780 Asymptomatic menopausal state: Secondary | ICD-10-CM | POA: Diagnosis not present

## 2021-02-20 DIAGNOSIS — D6800 Von Willebrand disease, unspecified: Secondary | ICD-10-CM

## 2021-02-20 DIAGNOSIS — N950 Postmenopausal bleeding: Secondary | ICD-10-CM

## 2021-02-20 DIAGNOSIS — R140 Abdominal distension (gaseous): Secondary | ICD-10-CM | POA: Insufficient documentation

## 2021-02-20 DIAGNOSIS — F32A Depression, unspecified: Secondary | ICD-10-CM | POA: Insufficient documentation

## 2021-02-20 DIAGNOSIS — Z79899 Other long term (current) drug therapy: Secondary | ICD-10-CM | POA: Insufficient documentation

## 2021-02-20 DIAGNOSIS — D66 Hereditary factor VIII deficiency: Secondary | ICD-10-CM | POA: Insufficient documentation

## 2021-02-20 DIAGNOSIS — R634 Abnormal weight loss: Secondary | ICD-10-CM | POA: Diagnosis not present

## 2021-02-20 NOTE — Patient Instructions (Signed)
Preparing for your Surgery  Plan for surgery on February 26, 2021 with Dr. Eugene Garnet at [facility]. You will be scheduled for a dilation and curettage of the uterus with hysteroscopy and myosure, possible ultrasound guidance for the procedure.   Pre-operative Testing -You will receive a phone call from presurgical testing at [facility] to arrange for a pre-operative appointment, labs if needed, and COVID test. The COVID test normally happens 3 days prior to the surgery and they ask that you self quarantine after the test up until surgery to decrease chance of exposure.  -Bring your insurance card, copy of an advanced directive if applicable, medication list   -You should not be taking blood thinners or aspirin at least ten days prior to surgery unless instructed by your surgeon.  -Do not take supplements such as fish oil (omega 3), red yeast rice, turmeric before your surgery. You want to avoid medications with aspirin in them including headache powders such as BC or Goody's), Excedrin migraine.  Day Before Surgery at Home -You will be advised you can have clear liquids up until 3 hours before your surgery.    Your role in recovery Your role is to become active as soon as directed by your doctor, while still giving yourself time to heal.  Rest when you feel tired. You will be asked to do the following in order to speed your recovery:  - Cough and breathe deeply. This helps to clear and expand your lungs and can prevent pneumonia after surgery.  - STAY ACTIVE WHEN YOU GET HOME. Do mild physical activity. Walking or moving your legs help your circulation and body functions return to normal. Do not try to get up or walk alone the first time after surgery.   -If you develop swelling on one leg or the other, pain in the back of your leg, redness/warmth in one of your legs, please call the office or go to the Emergency Room to have a doppler to rule out a  blood clot. For shortness of breath, chest pain-seek care in the Emergency Room as soon as possible. - Actively manage your pain. Managing your pain lets you move in comfort. We will ask you to rate your pain on a scale of zero to 10. It is your responsibility to tell your doctor or nurse where and how much you hurt so your pain can be treated.  Pain Management After Surgery  -Make sure that you have Tylenol and Ibuprofen at home to use on a regular basis after surgery for pain control. We recommend alternating the medications every hour to six hours since they work differently and are processed in the body differently for pain relief.  Bowel Regimen -  It is important to prevent constipation and drink adequate amounts of liquids.   Risks of Surgery Risks of surgery are low but include bleeding, infection, damage to surrounding structures, re-operation, blood clots, and very rarely death.  AFTER SURGERY INSTRUCTIONS  Return to work:  if applicable  Activity: 1. Be up and out of the bed during the day.  Take a nap if needed.  You may walk up steps but be careful and use the hand rail.  Stair climbing will tire you more than you think, you may need to stop part way and rest.   2. No lifting or straining for 2 weeks over 10 pounds. No pushing, pulling, straining for 2 weeks.  3. No driving for minimum of  24 hours after the procedure.  Do not drive until your reaction time has returned.   4. You can shower as soon as the next day after surgery. Shower daily.  Use your regular soap and water (not directly on the incision) and pat your incision(s) dry afterwards; don't rub.  No tub baths or submerging your body in water until cleared by your surgeon.  5. No sexual activity and nothing in the vagina for 2 weeks.  8. You may experience vaginal spotting after the procedure.  The spotting is normal but if you experience heavy bleeding, call our office.  9. Take Tylenol or ibuprofen for pain.   Monitor your Tylenol intake to a max of 4,000 mg in a 24 hour period. You can alternate these medications after surgery.  Diet: 1. Low sodium Heart Healthy Diet is recommended but you are cleared to resume your normal (before surgery) diet after your procedure.  2. It is safe to use a laxative, such as Miralax or Colace, if you have difficulty moving your bowels.  Wound Care: 1. Keep clean and dry.  Shower daily.  Reasons to call the Doctor:  Fever - Oral temperature greater than 100.4 degrees Fahrenheit  Foul-smelling vaginal discharge  Difficulty urinating  Nausea and vomiting  Increased pain at the site of the incision that is unrelieved with pain medicine.  Difficulty breathing with or without chest pain  New calf pain especially if only on one side  Sudden, continuing increased vaginal bleeding with or without clots.   Contacts: For questions or concerns you should contact:  Dr. Eugene GarnetKatherine Tucker at [phone]  Warner MccreedyMelissa Meshach Perry, NP at [phone]  After Hours: call [phone] and have the GYN Oncologist paged/contacted (after 5 pm or on the weekends).   Dilation and Curettage or Vacuum Curettage  Dilation and curettage (D&C) and vacuum curettage are minor procedures. A D&C involves stretching the cervix (dilation) and scraping the inside lining of the uterus with surgical instruments (curettage). During a D&C, tissue is gently scraped from the lining of the uterus (endometrium), starting from the top portion of the uterus down to the lowest part of the uterus. During a vacuum curettage, the lining and tissue in the uterus are removed with the use of gentle suction. Curettage may be performed to either diagnose or treat a problem. For diagnosis A diagnostic curettage may be done if you have:  Irregular bleeding in the uterus.  Bleeding with the development of clots.  Spotting between menstrual periods.  Prolonged menstrual periods or other abnormal  bleeding.  Bleeding after menopause.  No menstrual period (amenorrhea).  A change in size and shape of the uterus.  Abnormal endometrial cells discovered during a Pap test. For treatment Curettage may be done:  To remove an IUD (intrauterine device).  To remove remaining placenta after giving birth.  During an abortion.  During a miscarriage.  To remove growths in the lining of the uterus.  To remove some rare types of non-cancerous lumps (fibroids). Tell a health care provider about:  Any allergies you have, including allergies to prescribed medicine or latex.  All medicines you are taking, including vitamins, herbs, eye drops, creams, and over-the-counter medicines.  Any blood-thinning medicine you may be taking.  Any problems you or family members have had with anesthetic medicines.  Any blood disorders you have.  Any surgeries you have had.  Your medical history and any medical conditions you have.  Whether you are pregnant or may be pregnant.  Recent  vaginal infections you have had.  Recent menstrual periods, bleeding problems you have had, and what form of birth control (contraception) you use. What are the risks? Generally, this is a safe procedure. However, problems may occur, including:  Infection.  Heavy vaginal bleeding.  Allergic reactions to medicines.  Damage to the cervix or other structures or organs.  Development of scar tissue (adhesions) inside the uterus. This can cause abnormal periods and may make it harder to get pregnant.  A hole (perforation) in the wall of the uterus. This is rare. General instructions  Do not use any products that contain nicotine or tobacco for at least 4 weeks before the procedure. These products include cigarettes, e-cigarettes, and chewing tobacco. If you need help quitting, ask your health care provider.  For 24 hours before your procedure, do not: ? Douche. ? Use tampons. ? Use medicines, creams, or  suppositories in the vagina. ? Have sex.  You may be given a pregnancy test on the day of the procedure.  You may have a blood or urine sample taken.  Plan to have someone take you home from the hospital or clinic.  If you will be going home right after the procedure, plan to have someone with you for 24 hours. What happens during the procedure?  An IV will be inserted into one of your veins.  You will be given one of the following: ? A medicine that numbs the area in and around the cervix (local anesthetic). ? A medicine to make you fall asleep (general anesthetic).  You will lie down on your back, with your feet in foot rests (stirrups).  The size and position of your uterus will be checked.  A lubricated instrument (speculum or Sims retractor) will be inserted into the back side of your vagina. The speculum will be used to hold apart the walls of your vagina so your health care provider can see your cervix.  A tool (tenaculum) will be attached to the lip of the cervix to stabilize it.  Your cervix will be softened and dilated. This may be done by: ? Taking medicine, either orally or vaginally. ? Having thin rods (laminaria) or gradual widening instruments (tapered dilators) inserted into your cervix.  A small, sharp, curved instrument (curette) will be used to scrape a small amount of tissue or cells from the endometrium or cervical canal. In some cases, gentle suction is applied with the curette.  The curette will then be removed.  The cells will be taken to a lab for testing. The procedure may vary among health care providers and hospitals.   What happens after the procedure?  Your blood pressure, heart rate, breathing rate, and blood oxygen level will be monitored until you leave the hospital or clinic.  You may have mild cramping, backache, pain, and light bleeding or spotting. You may pass small blood clots from your vagina.  You may have to wear compression  stockings. These stockings help to prevent blood clots and reduce swelling in your legs. Summary  Dilation and curettage (D&C) involves stretching (dilating) the cervix and scraping the inside lining of the uterus (curettage).  Follow your health care provider's instructions about when to stop eating and drinking, and whether to stop or change any medicines.  After the procedure, you may have mild cramping, backache, pain, and light bleeding or spotting. You may pass small blood clots from your vagina.  Plan to have someone take you home from the hospital or clinic. This  information is not intended to replace advice given to you by your health care provider. Make sure you discuss any questions you have with your health care provider. Document Revised: 01/08/2020 Document Reviewed: 01/08/2020 Elsevier Patient Education  2021 Elsevier Inc.   Messages sent via [facility] are for non-urgent matters and are not responded to after hours so for urgent needs, please call the after hours number.

## 2021-02-20 NOTE — H&P (View-Only) (Signed)
GYNECOLOGIC ONCOLOGY NEW PATIENT CONSULTATION   Patient Name: Mary Juarez  Patient Age: 65 y.o. Date of Service: 02/20/21 Referring Provider: Tish Frederickson NP  Primary Care Provider: Darrow Bussing, MD Consulting Provider: Eugene Garnet, MD   Assessment/Plan:  Postmenopausal patient with mildly thickened endometrial lining and ongoing postmenopausal bleeding.  [name] presents with her close friend today.  We had a discussion regarding her work-up thus far and causes of postmenopausal bleeding.  I stressed the need to given ongoing bleeding as well as mildly thickened lining on ultrasound for additional endometrial sampling.  We discussed the cough for ultrasound in terms of what constitutes a normal or thin endometrial lining in a postmenopausal patient.  Most guidelines used somewhere between 3 and 4 mm is a cut off.  4 mm gives a sensitivity of nearly 100% for ruling out endometrial cancer.  That being said, with ongoing bleeding, I recommend endometrial sampling.  Unfortunately, her endometrial biopsy in clinic was very poorly tolerated with only minimal sample collected.  Wound bleeding persists despite ultrasound findings or normal biopsy results, I recommend proceeding with further sampling in the form of a dilation and curettage.  We discussed that atrophy would be the most common cause of postmenopausal bleeding but that significant pathology, such as hyperplasia or malignancy should be ruled out.  Patient is amenable to moving forward with scheduling outpatient procedure.  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  On the imaging that I have, which is a report from an outside ultrasound, I do not see any mention of uterine cavity abnormality.  I will plan to do the dilation and curettage with hysteroscopy guidance and have ultrasound assistance  available.  Patient is also very worried about her von Willebrand disease.  We discussed that although there is a risk of bleeding with any procedure, that the risk of bleeding with this procedure is quite low.  We discussed the plan for exam under anesthesia, hysteroscopy, dilation and curettage, possible ultrasound guidance, and any other indicated procedures next week on 3/10.  The risks of the procedure were discussed with the patient including bleeding, need for blood transfusion, infection, uterine perforation with damage to surrounding structures requiring repair, medical complications related to anesthesia (including but not limited to pneumonia, cardiac arrest, deep venous thrombosis, and death).  The patient was able to have all of her questions answered.  Perioperative instructions and expectations were reviewed with her.  Postoperative medications were sent to her pharmacy.  A copy of this note was sent to the patient's referring provider.   70 minutes of total time was spent for this patient encounter, including preparation, face-to-face counseling with the patient and coordination of care, and documentation of the encounter.  Eugene Garnet, MD  Division of Gynecologic Oncology  Department of Obstetrics and Gynecology  [facility]  ___________________________________________  Chief Complaint: Chief Complaint  Patient presents with  . Thickened endometrium    Second Opinion    History of Present Illness:  Mary Juarez is a 65 y.o. y.o. female who is seen in consultation at the request of Tish Frederickson for an evaluation of postmenopausal bleeding.  Patient endorses menopause at the age of 79-43.  She denies any postmenopausal bleeding until December 10 at which time she had sharp left-sided pelvic pain.  She then had some older appearing spotting.  She called and  saw her primary care provider that day.  She was referred to gynecology and  ultimately was able to schedule an appointment.  She had work-up including pelvic exam, Pap test, ultrasound, and the most recently attempt at endometrial biopsy.  Her pelvic ultrasound, performed at [facility] on 2/15, showed a uterus measuring 5 x 2.5 x 2.7 cm with an endometrial lining of 4.6 mm.  Ovaries were normal in appearance.  Myometrium noted to be normal.  Pap test showed negative for intraepithelial lesion, HPV negative.  Endometrial biopsy on 2/24 showed rare benign endometrial fragments in the specimen consisting mainly of mucus and endocervical glands.  Given her discomfort and intolerance of exams, the patient required premedication with lorazepam and Tylenol prior to both pelvic exams and her procedure.  Her endometrial biopsy had to be aborted early due to patient discomfort.  Only a small sample was able to be obtained.  Prior to the biopsy, she notes having what she describes as fluctuations in her hormone levels the past several months.  This was especially bothersome in January and she notes moodiness during this time.  With regard to her bleeding, she would have a spot of older appearing blood on the tissue paper when she wiped.  She also noted some bright red rectal bleeding that has been attributed to hemorrhoids.  She saw a gastroenterologist for this recently.  Since her attempted endometrial biopsy, she had a week of bright red bleeding, increased in quantity from what she was previously having.  Since Monday of this week, she denies any vaginal bleeding.  Overall, patient notes that her appetite has been decreased for a number of weeks.  She is having to make herself eat.  She notes having early satiety and occasional bloating.  She reports about a 40 pound weight loss in the last 8-12 months.  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  She denies any attempt at weight loss.  She notes regular bowel  movements which is aided by her high consumption of vegetables.  She has become more regular in the last several months.  She denies any changes to urination, with increased frequency especially at night which she has had for decades.  She describes a significant GYN history with very painful and heavy periods from a young age.  She saw multiple providers for this and ultimately no cause was found.  At points previously, she would run to help deal with and treat her pain.  She had laparoscopic surgery a number of years ago that she was thought perhaps to have endometriosis.  She was told after the surgery that she had aches tensive scarring but the cause of this was unknown.  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  She had a previous attempted a D&C but this was abandoned due to inability to dilate the cervix and enter the uterine cavity.  She was placed on Megace subsequently for several months.  She also had a hysterosalpingogram at some point to check the patency of her fallopian tubes.  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  Her medical history is significant for von Willebrand's disease.  She has had no major bleeding episodes since she was diagnosed.  Patient lives in [place] with her husband.  She works as a Conservation officer, nature at [employer].  PAST MEDICAL HISTORY:  Past Medical History:  Diagnosis Date  . Anxiety   . Depression   . Hemophilia, vascular (HCC)   . PMB (postmenopausal bleeding)   . Von Willebrand's disease (HCC)      PAST SURGICAL HISTORY:  Past Surgical History:  Procedure Laterality Date  . CERVICAL POLYPECTOMY     Dr. Jarrel  . DIAGNOSTIC LAPAROSCOPY     for painful menses  . NASAL FRACTURE SURGERY      OB/GYN HISTORY:  OB History  Gravida Para Term Preterm AB Living  0 0 0 0 0 0  SAB IAB Ectopic Multiple Live Births  0 0 0 0 0    No LMP recorded. (Menstrual status:  Other).  Age at menarche: 12-13 Age at menopause: [age] Hx of HRT: Yes, use 1 year of cream previously Hx of STDs: Denies Last pap: Most recently this year, was negative History of abnormal pap smears: Denies  SCREENING STUDIES:  Last mammogram: 2020  Last colonoscopy: 2013  MEDICATIONS: Outpatient Encounter Medications as of 02/20/2021  Medication Sig  . acetaminophen (TYLENOL) 500 MG tablet Take 500-1,000 mg by mouth every 6 (six) hours as needed for mild pain, moderate pain, fever or headache.  . calcium-vitamin D (OSCAL WITH D) 500-200 MG-UNIT per tablet Take 1 tablet by mouth 2 (two) times daily.  . clobetasol ointment (TEMOVATE) 0.05 % Apply topically. Use as directed  . clotrimazole-betamethasone (LOTRISONE) cream Apply topically 2 (two) times daily as needed.  . diclofenac Sodium (VOLTAREN) 1 % GEL See admin instructions.  . hydrocortisone 2.5 % cream Apply 1 application topically daily.  . Multiple Vitamins-Minerals (MULTIVITAMIN ADULT, MINERALS, PO) Take 1 tablet by mouth daily.  . venlafaxine XR (EFFEXOR-XR) 37.5 MG 24 hr capsule Take 37.5 mg by mouth at bedtime.  . [DISCONTINUED] Triprolidine-Pseudoephedrine (ANTIHISTAMINE PO) Take 1 tablet by mouth daily as needed (for allergies).   No facility-administered encounter medications on file as of 02/20/2021.    ALLERGIES:  Allergies  Allergen Reactions  . Septra [Sulfamethoxazole-Trimethoprim] Other (See Comments)    Severe migraine   . Adhesive [Tape] Rash  . Latex Rash  . Tamiflu [Oseltamivir] Nausea And Vomiting     FAMILY HISTORY:  Family History  Problem Relation Age of Onset  . Stroke Mother   . Heart disease Mother        pacemaker  . Heart attack Father   . Kidney failure Father   . Heart disease Maternal Grandmother        pacemaker  . Heart attack Maternal Grandfather   . Sudden death Maternal Grandfather   . Cancer Paternal Grandmother        oral - tobacco  . Heart attack Paternal Grandfather   .  Sudden death Paternal Grandfather   . Heart disease Brother        secondary cardiomyopathy, PVCs, atherosclerosis of native artery, syncope & collapse - sees cardiologist     SOCIAL HISTORY:  Social Connections: Not on file    REVIEW OF SYSTEMS:  Denies appetite changes, fevers, chills, fatigue, unexplained weight changes. Denies hearing loss, neck lumps or masses, mouth sores, ringing in ears or voice changes. Denies cough or wheezing.  Denies shortness of breath. Denies chest pain or palpitations. Denies leg swelling. Denies abdominal distention, pain, blood in stools, constipation, diarrhea, nausea, vomiting, or early satiety. Denies pain with intercourse, dysuria, frequency, hematuria or incontinence. Denies hot flashes, pelvic pain, or vaginal discharge.   Denies joint pain, back   pain or muscle pain/cramps. Denies itching, rash, or wounds. Denies dizziness, headaches, numbness or seizures. Denies swollen lymph nodes or glands, denies easy bruising or bleeding. Denies anxiety, depression, confusion, or decreased concentration.  Physical Exam:  Vital Signs for this encounter:  Blood pressure (!) 147/67, pulse 70, temperature (!) 96.6 F (35.9 C), temperature source Tympanic, resp. rate 18, height 5\' 4"  (1.626 m), weight 150 lb (68 kg), SpO2 98 %. Body mass index is 25.75 kg/m. General: Alert, oriented, no acute distress.  HEENT: Normocephalic, atraumatic. Sclera anicteric.  Chest: Clear to auscultation bilaterally. No wheezes, rhonchi, or rales. Cardiovascular: Regular rate and rhythm, no murmurs, rubs, or gallops.  Abdomen: Normoactive bowel sounds. Soft, nondistended, nontender to palpation. No masses or hepatosplenomegaly appreciated. No palpable fluid wave.  Extremities: Grossly normal range of motion. Warm, well perfused. No edema bilaterally.  Skin: No rashes or lesions.  Lymphatics: No cervical, supraclavicular, or inguinal adenopathy.  GU: Deferred  LABORATORY AND  RADIOLOGIC DATA:  Outside medical records were reviewed to synthesize the above history, along with the history and physical obtained during the visit.   Lab Results  Component Value Date   WBC 7.8 03/17/2015   HGB 11.8 (L) 03/17/2015   HCT 37.4 03/17/2015   PLT 360 03/17/2015   GLUCOSE 98 03/17/2015   ALT 25 03/17/2015   AST 26 03/17/2015   NA 139 03/17/2015   K 3.9 03/17/2015   CL 104 03/17/2015   CREATININE 0.56 03/17/2015   BUN 25 (H) 03/17/2015   CO2 26 03/17/2015

## 2021-02-20 NOTE — Progress Notes (Addendum)
GYNECOLOGIC ONCOLOGY NEW PATIENT CONSULTATION   Patient Name: Mary Juarez  Patient Age: 65 y.o. Date of Service: 02/20/21 Referring Provider: Tish Frederickson NP  Primary Care Provider: Darrow Bussing, MD Consulting Provider: Eugene Garnet, MD   Assessment/Plan:  Postmenopausal patient with mildly thickened endometrial lining and ongoing postmenopausal bleeding.  [name] presents with her close friend today.  We had a discussion regarding her work-up thus far and causes of postmenopausal bleeding.  I stressed the need to given ongoing bleeding as well as mildly thickened lining on ultrasound for additional endometrial sampling.  We discussed the cough for ultrasound in terms of what constitutes a normal or thin endometrial lining in a postmenopausal patient.  Most guidelines used somewhere between 3 and 4 mm is a cut off.  4 mm gives a sensitivity of nearly 100% for ruling out endometrial cancer.  That being said, with ongoing bleeding, I recommend endometrial sampling.  Unfortunately, her endometrial biopsy in clinic was very poorly tolerated with only minimal sample collected.  Wound bleeding persists despite ultrasound findings or normal biopsy results, I recommend proceeding with further sampling in the form of a dilation and curettage.  We discussed that atrophy would be the most common cause of postmenopausal bleeding but that significant pathology, such as hyperplasia or malignancy should be ruled out.  Patient is amenable to moving forward with scheduling outpatient procedure.  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  On the imaging that I have, which is a report from an outside ultrasound, I do not see any mention of uterine cavity abnormality.  I will plan to do the dilation and curettage with hysteroscopy guidance and have ultrasound assistance  available.  Patient is also very worried about her von Willebrand disease.  We discussed that although there is a risk of bleeding with any procedure, that the risk of bleeding with this procedure is quite low.  We discussed the plan for exam under anesthesia, hysteroscopy, dilation and curettage, possible ultrasound guidance, and any other indicated procedures next week on 3/10.  The risks of the procedure were discussed with the patient including bleeding, need for blood transfusion, infection, uterine perforation with damage to surrounding structures requiring repair, medical complications related to anesthesia (including but not limited to pneumonia, cardiac arrest, deep venous thrombosis, and death).  The patient was able to have all of her questions answered.  Perioperative instructions and expectations were reviewed with her.  Postoperative medications were sent to her pharmacy.  A copy of this note was sent to the patient's referring provider.   70 minutes of total time was spent for this patient encounter, including preparation, face-to-face counseling with the patient and coordination of care, and documentation of the encounter.  Eugene Garnet, MD  Division of Gynecologic Oncology  Department of Obstetrics and Gynecology  [facility]  ___________________________________________  Chief Complaint: Chief Complaint  Patient presents with  . Thickened endometrium    Second Opinion    History of Present Illness:  Mary Juarez is a 65 y.o. y.o. female who is seen in consultation at the request of Tish Frederickson for an evaluation of postmenopausal bleeding.  Patient endorses menopause at the age of 79-43.  She denies any postmenopausal bleeding until December 10 at which time she had sharp left-sided pelvic pain.  She then had some older appearing spotting.  She called and  saw her primary care provider that day.  She was referred to gynecology and  ultimately was able to schedule an appointment.  She had work-up including pelvic exam, Pap test, ultrasound, and the most recently attempt at endometrial biopsy.  Her pelvic ultrasound, performed at [facility] on 2/15, showed a uterus measuring 5 x 2.5 x 2.7 cm with an endometrial lining of 4.6 mm.  Ovaries were normal in appearance.  Myometrium noted to be normal.  Pap test showed negative for intraepithelial lesion, HPV negative.  Endometrial biopsy on 2/24 showed rare benign endometrial fragments in the specimen consisting mainly of mucus and endocervical glands.  Given her discomfort and intolerance of exams, the patient required premedication with lorazepam and Tylenol prior to both pelvic exams and her procedure.  Her endometrial biopsy had to be aborted early due to patient discomfort.  Only a small sample was able to be obtained.  Prior to the biopsy, she notes having what she describes as fluctuations in her hormone levels the past several months.  This was especially bothersome in January and she notes moodiness during this time.  With regard to her bleeding, she would have a spot of older appearing blood on the tissue paper when she wiped.  She also noted some bright red rectal bleeding that has been attributed to hemorrhoids.  She saw a gastroenterologist for this recently.  Since her attempted endometrial biopsy, she had a week of bright red bleeding, increased in quantity from what she was previously having.  Since Monday of this week, she denies any vaginal bleeding.  Overall, patient notes that her appetite has been decreased for a number of weeks.  She is having to make herself eat.  She notes having early satiety and occasional bloating.  She reports about a 40 pound weight loss in the last 8-12 months.  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  She denies any attempt at weight loss.  She notes regular bowel  movements which is aided by her high consumption of vegetables.  She has become more regular in the last several months.  She denies any changes to urination, with increased frequency especially at night which she has had for decades.  She describes a significant GYN history with very painful and heavy periods from a young age.  She saw multiple providers for this and ultimately no cause was found.  At points previously, she would run to help deal with and treat her pain.  She had laparoscopic surgery a number of years ago that she was thought perhaps to have endometriosis.  She was told after the surgery that she had aches tensive scarring but the cause of this was unknown.  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  She had a previous attempted a D&C but this was abandoned due to inability to dilate the cervix and enter the uterine cavity.  She was placed on Megace subsequently for several months.  She also had a hysterosalpingogram at some point to check the patency of her fallopian tubes.  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  Her medical history is significant for von Willebrand's disease.  She has had no major bleeding episodes since she was diagnosed.  Patient lives in [place] with her husband.  She works as a Conservation officer, nature at [employer].  PAST MEDICAL HISTORY:  Past Medical History:  Diagnosis Date  . Anxiety   . Depression   . Hemophilia, vascular (HCC)   . PMB (postmenopausal bleeding)   . Von Willebrand's disease [facility](HCC)      PAST SURGICAL HISTORY:  Past Surgical History:  Procedure Laterality Date  . CERVICAL POLYPECTOMY     Dr. Freddie ApleyJarrel  . DIAGNOSTIC LAPAROSCOPY     for painful menses  . NASAL FRACTURE SURGERY      OB/GYN HISTORY:  OB History  Gravida Para Term Preterm AB Living  0 0 0 0 0 0  SAB IAB Ectopic Multiple Live Births  0 0 0 0 0    No LMP recorded. (Menstrual status:  Other).  Age at menarche: [age] Age at menopause: [age] Hx of HRT: Yes, use 1 year of cream previously Hx of STDs: Denies Last pap: Most recently this year, was negative History of abnormal pap smears: Denies  SCREENING STUDIES:  Last mammogram: 2020  Last colonoscopy: 2013  MEDICATIONS: Outpatient Encounter Medications as of 02/20/2021  Medication Sig  . acetaminophen (TYLENOL) 500 MG tablet Take 500-1,000 mg by mouth every 6 (six) hours as needed for mild pain, moderate pain, fever or headache.  . calcium-vitamin D (OSCAL WITH D) 500-200 MG-UNIT per tablet Take 1 tablet by mouth 2 (two) times daily.  . clobetasol ointment (TEMOVATE) 0.05 % Apply topically. Use as directed  . clotrimazole-betamethasone (LOTRISONE) cream Apply topically 2 (two) times daily as needed.  . diclofenac Sodium (VOLTAREN) 1 % GEL See admin instructions.  . hydrocortisone 2.5 % cream Apply 1 application topically daily.  . Multiple Vitamins-Minerals (MULTIVITAMIN ADULT, MINERALS, PO) Take 1 tablet by mouth daily.  Marland Kitchen. venlafaxine XR (EFFEXOR-XR) 37.5 MG 24 hr capsule Take 37.5 mg by mouth at bedtime.  . [DISCONTINUED] Triprolidine-Pseudoephedrine (ANTIHISTAMINE PO) Take 1 tablet by mouth daily as needed (for allergies).   No facility-administered encounter medications on file as of 02/20/2021.    ALLERGIES:  Allergies  Allergen Reactions  . Septra [Sulfamethoxazole-Trimethoprim] Other (See Comments)    Severe migraine   . Adhesive [Tape] Rash  . Latex Rash  . Tamiflu [Oseltamivir] Nausea And Vomiting     FAMILY HISTORY:  Family History  Problem Relation Age of Onset  . Stroke Mother   . Heart disease Mother        pacemaker  . Heart attack Father   . Kidney failure Father   . Heart disease Maternal Grandmother        pacemaker  . Heart attack Maternal Grandfather   . Sudden death Maternal Grandfather   . Cancer Paternal Grandmother        oral - tobacco  . Heart attack Paternal Grandfather   .  Sudden death Paternal Grandfather   . Heart disease Brother        secondary cardiomyopathy, PVCs, atherosclerosis of native artery, syncope & collapse - sees cardiologist     SOCIAL HISTORY:  Social Connections: Not on file    REVIEW OF SYSTEMS:  Denies appetite changes, fevers, chills, fatigue, unexplained weight changes. Denies hearing loss, neck lumps or masses, mouth sores, ringing in ears or voice changes. Denies cough or wheezing.  Denies shortness of breath. Denies chest pain or palpitations. Denies leg swelling. Denies abdominal distention, pain, blood in stools, constipation, diarrhea, nausea, vomiting, or early satiety. Denies pain with intercourse, dysuria, frequency, hematuria or incontinence. Denies hot flashes, pelvic pain, or vaginal discharge.   Denies joint pain, back  pain or muscle pain/cramps. Denies itching, rash, or wounds. Denies dizziness, headaches, numbness or seizures. Denies swollen lymph nodes or glands, denies easy bruising or bleeding. Denies anxiety, depression, confusion, or decreased concentration.  Physical Exam:  Vital Signs for this encounter:  Blood pressure (!) 147/67, pulse 70, temperature (!) 96.6 F (35.9 C), temperature source Tympanic, resp. rate 18, height 5\' 4"  (1.626 m), weight 150 lb (68 kg), SpO2 98 %. Body mass index is 25.75 kg/m. General: Alert, oriented, no acute distress.  HEENT: Normocephalic, atraumatic. Sclera anicteric.  Chest: Clear to auscultation bilaterally. No wheezes, rhonchi, or rales. Cardiovascular: Regular rate and rhythm, no murmurs, rubs, or gallops.  Abdomen: Normoactive bowel sounds. Soft, nondistended, nontender to palpation. No masses or hepatosplenomegaly appreciated. No palpable fluid wave.  Extremities: Grossly normal range of motion. Warm, well perfused. No edema bilaterally.  Skin: No rashes or lesions.  Lymphatics: No cervical, supraclavicular, or inguinal adenopathy.  GU: Deferred  LABORATORY AND  RADIOLOGIC DATA:  Outside medical records were reviewed to synthesize the above history, along with the history and physical obtained during the visit.   Lab Results  Component Value Date   WBC 7.8 03/17/2015   HGB 11.8 (L) 03/17/2015   HCT 37.4 03/17/2015   PLT 360 03/17/2015   GLUCOSE 98 03/17/2015   ALT 25 03/17/2015   AST 26 03/17/2015   NA 139 03/17/2015   K 3.9 03/17/2015   CL 104 03/17/2015   CREATININE 0.56 03/17/2015   BUN 25 (H) 03/17/2015   CO2 26 03/17/2015

## 2021-02-20 NOTE — Progress Notes (Addendum)
ADDENDUM:  02-25-2021--- Have received secure chat from [facility] at Dr Pricilla Holm office. Stated pt will need T&S per Warner Mccreedy NP dos, order is in epic, and states to call blood bank if any thing else is needed to be drawn since pt has Von Willebrand's blood disorder.  Pt is not on any blood thinner's and has never had a blood clot.   Spoke w/ via phone for pre-op interview--- PT Lab needs dos----  no             Lab results---- pt getting CBC/ BMP done 02-24-2021 @ 0915 COVID test ------ 02-23-2021 @ 0855 Arrive at ------- 0930 on 02-26-2021 NPO after MN NO Solid Food.  Clear liquids from MN until--- 0830 Medications to take morning of surgery ----- NONE Diabetic medication ----- n/a Patient Special Instructions ----- n/a Pre-Op special Istructions ----- n/a Patient verbalized understanding of instructions that were given at this phone interview. Patient denies shortness of breath, chest pain, fever, cough at this phone interview.

## 2021-02-23 ENCOUNTER — Telehealth: Payer: Self-pay

## 2021-02-23 ENCOUNTER — Other Ambulatory Visit (HOSPITAL_COMMUNITY)
Admission: RE | Admit: 2021-02-23 | Discharge: 2021-02-23 | Disposition: A | Discharge status: Home / Self Care | Payer: No Typology Code available for payment source | Source: Ambulatory Visit | Attending: Gynecologic Oncology | Admitting: Gynecologic Oncology

## 2021-02-23 ENCOUNTER — Telehealth: Payer: Self-pay | Admitting: Gynecologic Oncology

## 2021-02-23 DIAGNOSIS — Z01812 Encounter for preprocedural laboratory examination: Secondary | ICD-10-CM | POA: Insufficient documentation

## 2021-02-23 DIAGNOSIS — Z20822 Contact with and (suspected) exposure to covid-19: Secondary | ICD-10-CM | POA: Diagnosis not present

## 2021-02-23 LAB — SARS CORONAVIRUS 2 (TAT 6-24 HRS): SARS Coronavirus 2: NEGATIVE

## 2021-02-23 NOTE — Telephone Encounter (Signed)
Called patient to answer questions about surgery. No answer. Left VM with callback number.  Eugene Garnet MD Gynecologic Oncology

## 2021-02-24 ENCOUNTER — Encounter (HOSPITAL_COMMUNITY)
Admission: RE | Admit: 2021-02-24 | Discharge: 2021-02-24 | Disposition: A | Discharge status: Home / Self Care | Payer: No Typology Code available for payment source | Source: Ambulatory Visit | Attending: Gynecologic Oncology | Admitting: Gynecologic Oncology

## 2021-02-24 ENCOUNTER — Telehealth: Payer: Self-pay

## 2021-02-24 ENCOUNTER — Other Ambulatory Visit: Payer: Self-pay

## 2021-02-24 DIAGNOSIS — Z01812 Encounter for preprocedural laboratory examination: Secondary | ICD-10-CM | POA: Diagnosis present

## 2021-02-24 LAB — BASIC METABOLIC PANEL
Anion gap: 4 — ABNORMAL LOW (ref 5–15)
BUN: 14 mg/dL (ref 8–23)
CO2: 29 mmol/L (ref 22–32)
Calcium: 10 mg/dL (ref 8.9–10.3)
Chloride: 103 mmol/L (ref 98–111)
Creatinine, Ser: 0.65 mg/dL (ref 0.44–1.00)
GFR, Estimated: >60 mL/min (ref >60)
Glucose, Bld: 97 mg/dL (ref 70–99)
Potassium: 4.7 mmol/L (ref 3.5–5.1)
Sodium: 136 mmol/L (ref 135–145)

## 2021-02-24 LAB — CBC
HCT: 40.8 % (ref 36.0–46.0)
Hemoglobin: 13.2 g/dL (ref 12.0–15.0)
MCH: 27.6 pg (ref 26.0–34.0)
MCHC: 32.4 g/dL (ref 30.0–36.0)
MCV: 85.2 fL (ref 80.0–100.0)
Platelets: 321 10*3/uL (ref 150–400)
RBC: 4.79 MIL/uL (ref 3.87–5.11)
RDW: 12.9 % (ref 11.5–15.5)
WBC: 5.5 10*3/uL (ref 4.0–10.5)
nRBC: 0 % (ref 0.0–0.2)

## 2021-02-24 NOTE — Telephone Encounter (Signed)
Answered patient's questions regarding CPT Codes for surgery and lab procedures.

## 2021-02-24 NOTE — Telephone Encounter (Signed)
Reviewed pre-op instructions with Ms Sesay. Ms Schendel verbalized understanding.

## 2021-02-25 ENCOUNTER — Telehealth: Payer: Self-pay

## 2021-02-25 NOTE — Telephone Encounter (Signed)
Pre surgery call made to patient.  No answer, left message to return call.

## 2021-02-26 ENCOUNTER — Other Ambulatory Visit: Payer: Self-pay

## 2021-02-26 ENCOUNTER — Ambulatory Visit (HOSPITAL_BASED_OUTPATIENT_CLINIC_OR_DEPARTMENT_OTHER)
Admission: RE | Admit: 2021-02-26 | Discharge: 2021-02-26 | Disposition: A | Discharge status: Home / Self Care | Payer: No Typology Code available for payment source | Attending: Gynecologic Oncology | Admitting: Gynecologic Oncology

## 2021-02-26 ENCOUNTER — Encounter (HOSPITAL_BASED_OUTPATIENT_CLINIC_OR_DEPARTMENT_OTHER): Payer: Self-pay | Admitting: Gynecologic Oncology

## 2021-02-26 ENCOUNTER — Telehealth: Payer: Self-pay | Admitting: Oncology

## 2021-02-26 ENCOUNTER — Ambulatory Visit (HOSPITAL_BASED_OUTPATIENT_CLINIC_OR_DEPARTMENT_OTHER): Payer: No Typology Code available for payment source | Admitting: Anesthesiology

## 2021-02-26 ENCOUNTER — Encounter (HOSPITAL_BASED_OUTPATIENT_CLINIC_OR_DEPARTMENT_OTHER)
Admission: RE | Disposition: A | Discharge status: Home / Self Care | Payer: Self-pay | Source: Home / Self Care | Attending: Gynecologic Oncology

## 2021-02-26 ENCOUNTER — Ambulatory Visit (HOSPITAL_COMMUNITY)
Admission: RE | Admit: 2021-02-26 | Payer: No Typology Code available for payment source | Source: Ambulatory Visit | Admitting: Gynecologic Oncology

## 2021-02-26 DIAGNOSIS — D6800 Von Willebrand disease, unspecified: Secondary | ICD-10-CM

## 2021-02-26 DIAGNOSIS — N950 Postmenopausal bleeding: Secondary | ICD-10-CM

## 2021-02-26 DIAGNOSIS — Z538 Procedure and treatment not carried out for other reasons: Secondary | ICD-10-CM | POA: Insufficient documentation

## 2021-02-26 DIAGNOSIS — R9389 Abnormal findings on diagnostic imaging of other specified body structures: Secondary | ICD-10-CM

## 2021-02-26 HISTORY — DX: Frequency of micturition: R35.0

## 2021-02-26 HISTORY — DX: Other complications of anesthesia, initial encounter: T88.59XA

## 2021-02-26 HISTORY — DX: Personal history of traumatic brain injury: Z87.820

## 2021-02-26 HISTORY — DX: Presence of spectacles and contact lenses: Z97.3

## 2021-02-26 HISTORY — DX: Personal history of other specified conditions: Z87.898

## 2021-02-26 HISTORY — DX: Abnormal findings on diagnostic imaging of other specified body structures: R93.89

## 2021-02-26 LAB — TYPE AND SCREEN
ABO/RH(D): O POS
Antibody Screen: NEGATIVE

## 2021-02-26 LAB — ABO/RH: ABO/RH(D): O POS

## 2021-02-26 SURGERY — CANCELLED PROCEDURE
Anesthesia: General

## 2021-02-26 MED ORDER — GABAPENTIN 100 MG PO CAPS
ORAL_CAPSULE | ORAL | Status: AC
  Filled 2021-02-26: qty 2

## 2021-02-26 MED ORDER — SCOPOLAMINE 1 MG/3DAYS TD PT72
1.0000 | MEDICATED_PATCH | TRANSDERMAL | Status: DC
  Administered 2021-02-26: 1.5 mg via TRANSDERMAL

## 2021-02-26 MED ORDER — GABAPENTIN 100 MG PO CAPS
200.0000 mg | ORAL_CAPSULE | ORAL | Status: AC
  Administered 2021-02-26: 200 mg via ORAL

## 2021-02-26 MED ORDER — LACTATED RINGERS IV SOLN: INTRAVENOUS | Status: DC

## 2021-02-26 MED ORDER — DEXAMETHASONE SODIUM PHOSPHATE 10 MG/ML IJ SOLN: 4.0000 mg | INTRAMUSCULAR | Status: DC

## 2021-02-26 MED ORDER — DEXAMETHASONE SODIUM PHOSPHATE 10 MG/ML IJ SOLN
INTRAMUSCULAR | Status: AC
  Filled 2021-02-26: qty 1

## 2021-02-26 MED ORDER — ACETAMINOPHEN 500 MG PO TABS
ORAL_TABLET | ORAL | Status: AC
  Filled 2021-02-26: qty 2

## 2021-02-26 MED ORDER — FENTANYL CITRATE (PF) 100 MCG/2ML IJ SOLN
INTRAMUSCULAR | Status: AC
  Filled 2021-02-26: qty 2

## 2021-02-26 MED ORDER — SCOPOLAMINE 1 MG/3DAYS TD PT72
MEDICATED_PATCH | TRANSDERMAL | Status: AC
  Filled 2021-02-26: qty 1

## 2021-02-26 MED ORDER — LIDOCAINE 2% (20 MG/ML) 5 ML SYRINGE
INTRAMUSCULAR | Status: AC
  Filled 2021-02-26: qty 5

## 2021-02-26 MED ORDER — PROPOFOL 10 MG/ML IV BOLUS
INTRAVENOUS | Status: AC
  Filled 2021-02-26: qty 20

## 2021-02-26 MED ORDER — MIDAZOLAM HCL 2 MG/2ML IJ SOLN
INTRAMUSCULAR | Status: AC
  Filled 2021-02-26: qty 2

## 2021-02-26 MED ORDER — KETOROLAC TROMETHAMINE 15 MG/ML IJ SOLN: 15.0000 mg | INTRAMUSCULAR | Status: DC

## 2021-02-26 MED ORDER — ACETAMINOPHEN 500 MG PO TABS
1000.0000 mg | ORAL_TABLET | ORAL | Status: AC
  Administered 2021-02-26: 1000 mg via ORAL

## 2021-02-26 MED ORDER — ONDANSETRON HCL 4 MG/2ML IJ SOLN
INTRAMUSCULAR | Status: AC
  Filled 2021-02-26: qty 2

## 2021-02-26 SURGICAL SUPPLY — 22 items
BIPOLAR CUTTING LOOP 21FR (ELECTRODE)
CANISTER SUCT 3000ML PPV (MISCELLANEOUS) IMPLANT
CATH ROBINSON RED A/P 16FR (CATHETERS) IMPLANT
COVER WAND RF STERILE (DRAPES) IMPLANT
DEVICE MYOSURE LITE (MISCELLANEOUS) IMPLANT
DEVICE MYOSURE REACH (MISCELLANEOUS) IMPLANT
DILATOR CANAL MILEX (MISCELLANEOUS) IMPLANT
GAUZE 4X4 16PLY RFD (DISPOSABLE) IMPLANT
GLOVE SURG ENC MOIS LTX SZ6 (GLOVE) IMPLANT
GLOVE SURG ENC MOIS LTX SZ6.5 (GLOVE) IMPLANT
GOWN STRL REUS W/TWL LRG LVL3 (GOWN DISPOSABLE) IMPLANT
IV NS IRRIG 3000ML ARTHROMATIC (IV SOLUTION) IMPLANT
KIT TURNOVER CYSTO (KITS) IMPLANT
LOOP CUTTING BIPOLAR 21FR (ELECTRODE) IMPLANT
MYOSURE XL FIBROID REM (MISCELLANEOUS)
PACK VAGINAL MINOR WOMEN LF (CUSTOM PROCEDURE TRAY) IMPLANT
PAD OB MATERNITY 4.3X12.25 (PERSONAL CARE ITEMS) IMPLANT
PAD PREP 24X48 CUFFED NSTRL (MISCELLANEOUS) IMPLANT
SEAL ROD LENS SCOPE MYOSURE (ABLATOR) IMPLANT
SYSTEM TISS REMOVAL MYSR XL RM (MISCELLANEOUS) IMPLANT
TOWEL OR 17X26 10 PK STRL BLUE (TOWEL DISPOSABLE) IMPLANT
WATER STERILE IRR 500ML POUR (IV SOLUTION) IMPLANT

## 2021-02-26 NOTE — H&P (View-Only) (Signed)
Patient was scheduled for EUA, hysteroscopy, D&C today for work-up of PMB. She and I discussed her history of vWD at her clinic visit (while there was a chart history of hemophilia documented, this was was brought up during our visit when we discussed her VWD). The patient's prior hematology notes are not visible on CareEverywhere. The patient has not seen a hematologist in a number of years.  Upon arrival at the [facility] today, she voiced to anesthesia having a history of both hemophilia and vWD. Given this confusion and inability to confirm or refute diagnoses, decision made to cancel surgery from today. Patient will bring records she has at home today or tomorrow and sign a release of records. We will refer for hematology appointment and plan to reschedule surgery after.  Eugene Garnet MD Gynecologic Oncology

## 2021-02-26 NOTE — Progress Notes (Signed)
Dr. Renold Don and Dr. Pricilla Holm in to see concerning Von Willibrand's disease and hemophilia. Surgery cancelled out of caution for remote bleeding chance. IV and Scop patch removed. Dr. Pricilla Holm to setup consult for hematology. Patient and husband understanding of situation.

## 2021-02-26 NOTE — Anesthesia Preprocedure Evaluation (Deleted)
Anesthesia Evaluation  Patient identified by MRN, date of birth, ID band Patient awake    Reviewed: Allergy & Precautions, NPO status , Patient's Chart, lab work & pertinent test results  Airway Mallampati: II  TM Distance: >3 FB Neck ROM: Full    Dental no notable dental hx. (+) Dental Advisory Given, Teeth Intact   Pulmonary neg pulmonary ROS,    Pulmonary exam normal breath sounds clear to auscultation       Cardiovascular negative cardio ROS Normal cardiovascular exam Rhythm:Regular Rate:Normal     Neuro/Psych PSYCHIATRIC DISORDERS Anxiety Depression negative neurological ROS     GI/Hepatic negative GI ROS, Neg liver ROS,   Endo/Other  negative endocrine ROS  Renal/GU negative Renal ROS     Musculoskeletal negative musculoskeletal ROS (+)   Abdominal (+) - obese,   Peds  Hematology  (+) Blood dyscrasia, , VWD   Anesthesia Other Findings   Reproductive/Obstetrics                            Anesthesia Physical Anesthesia Plan  ASA: III  Anesthesia Plan: General   Post-op Pain Management:    Induction: Intravenous  PONV Risk Score and Plan: 4 or greater and Ondansetron, Dexamethasone, Midazolam, Scopolamine patch - Pre-op and Treatment may vary due to age or medical condition  Airway Management Planned: LMA  Additional Equipment:   Intra-op Plan:   Post-operative Plan: Extubation in OR  Informed Consent: I have reviewed the patients History and Physical, chart, labs and discussed the procedure including the risks, benefits and alternatives for the proposed anesthesia with the patient or authorized representative who has indicated his/her understanding and acceptance.     Dental advisory given  Plan Discussed with: CRNA  Anesthesia Plan Comments: (Before patient arrived today I was made aware of her history of Von Willebrands disease. No information from PAT available  about workup and no notes from hematology. Upon initially interviewing the patient a note was written by preop RN that she had hemophilia. 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 She seemed very knowledgeable about this and said she went through an extensive workup in [facility] "years ago." It appears that was in 2014. I couldn't find any lab work or notes indicating what workup she had or the diagnosis given. There were notes written in some places questioning the validity of her diagnosis of hemophilia, but again nothing concrete. I told the patient and the surgeon that I did not feel comfortable with doing her in an outpatient setting especially given the lack of clarity of her actual diagnosis or risks. )      Anesthesia Quick Evaluation

## 2021-02-26 NOTE — Telephone Encounter (Signed)
Left a message regarding appointment with Dr. Leonides Schanz.  Requested a return call to confirm appointment.

## 2021-02-26 NOTE — Progress Notes (Signed)
Patient was scheduled for EUA, hysteroscopy, D&C today for work-up of PMB. She and I discussed her history of vWD at her clinic visit (while there was a chart history of hemophilia documented, this was was brought up during our visit when we discussed her VWD). The patient's prior hematology notes are not visible on CareEverywhere. The patient has not seen a hematologist in a number of years.  Upon arrival at the [facility] today, she voiced to anesthesia having a history of both hemophilia and vWD. Given this confusion and inability to confirm or refute diagnoses, decision made to cancel surgery from today. Patient will bring records she has at home today or tomorrow and sign a release of records. We will refer for hematology appointment and plan to reschedule surgery after.  Leslieanne Cobarrubias MD Gynecologic Oncology  

## 2021-02-26 NOTE — Telephone Encounter (Signed)
[place] called back and confirmed the appointment with Dr. Leonides Schanz on 03/05/21 at 9:00.

## 2021-03-05 ENCOUNTER — Inpatient Hospital Stay (HOSPITAL_BASED_OUTPATIENT_CLINIC_OR_DEPARTMENT_OTHER): Payer: No Typology Code available for payment source | Admitting: Hematology and Oncology

## 2021-03-05 ENCOUNTER — Other Ambulatory Visit: Payer: Self-pay

## 2021-03-05 ENCOUNTER — Inpatient Hospital Stay: Payer: No Typology Code available for payment source

## 2021-03-05 VITALS — BP 114/66 | HR 72 | Temp 97.3°F | Resp 16 | Ht 64.0 in | Wt 148.9 lb

## 2021-03-05 DIAGNOSIS — D6800 Von Willebrand disease, unspecified: Secondary | ICD-10-CM

## 2021-03-05 DIAGNOSIS — R9389 Abnormal findings on diagnostic imaging of other specified body structures: Secondary | ICD-10-CM | POA: Diagnosis not present

## 2021-03-05 LAB — CMP (CANCER CENTER ONLY)
ALT: 18 U/L (ref 0–44)
AST: 18 U/L (ref 15–41)
Albumin: 4.6 g/dL (ref 3.5–5.0)
Alkaline Phosphatase: 65 U/L (ref 38–126)
Anion gap: 5 (ref 5–15)
BUN: 14 mg/dL (ref 8–23)
CO2: 28 mmol/L (ref 22–32)
Calcium: 9.7 mg/dL (ref 8.9–10.3)
Chloride: 104 mmol/L (ref 98–111)
Creatinine: 0.76 mg/dL (ref 0.44–1.00)
GFR, Estimated: >60 mL/min (ref >60)
Glucose, Bld: 93 mg/dL (ref 70–99)
Potassium: 4.4 mmol/L (ref 3.5–5.1)
Sodium: 137 mmol/L (ref 135–145)
Total Bilirubin: 0.7 mg/dL (ref 0.3–1.2)
Total Protein: 7.6 g/dL (ref 6.5–8.1)

## 2021-03-05 LAB — CBC WITH DIFFERENTIAL (CANCER CENTER ONLY)
Abs Immature Granulocytes: 0 10*3/uL (ref 0.00–0.07)
Basophils Absolute: 0 10*3/uL (ref 0.0–0.1)
Basophils Relative: 1 %
Eosinophils Absolute: 0.2 10*3/uL (ref 0.0–0.5)
Eosinophils Relative: 4 %
HCT: 38.2 % (ref 36.0–46.0)
Hemoglobin: 12.5 g/dL (ref 12.0–15.0)
Immature Granulocytes: 0 %
Lymphocytes Relative: 30 %
Lymphs Abs: 1.4 10*3/uL (ref 0.7–4.0)
MCH: 27.5 pg (ref 26.0–34.0)
MCHC: 32.7 g/dL (ref 30.0–36.0)
MCV: 84 fL (ref 80.0–100.0)
Monocytes Absolute: 0.3 10*3/uL (ref 0.1–1.0)
Monocytes Relative: 6 %
Neutro Abs: 2.7 10*3/uL (ref 1.7–7.7)
Neutrophils Relative %: 59 %
Platelet Count: 282 10*3/uL (ref 150–400)
RBC: 4.55 MIL/uL (ref 3.87–5.11)
RDW: 13 % (ref 11.5–15.5)
WBC Count: 4.5 10*3/uL (ref 4.0–10.5)
nRBC: 0 % (ref 0.0–0.2)

## 2021-03-05 LAB — PROTIME-INR
INR: 1 (ref 0.8–1.2)
Prothrombin Time: 12.7 seconds (ref 11.4–15.2)

## 2021-03-05 LAB — PLATELET FUNCTION ASSAY: Collagen / Epinephrine: 136 seconds (ref 0–193)

## 2021-03-05 LAB — APTT: aPTT: 40 seconds — ABNORMAL HIGH (ref 24–36)

## 2021-03-05 NOTE — Progress Notes (Signed)
[facility] [phone]   [phone]  INITIAL CONSULT NOTE  Patient Care Team: Darrow Bussing, MD as PCP - General (Family Medicine)  Hematological/Oncological History # Concern for von Willebrand Disease/Hemophilia 02/26/2021: scheduled for hysteroscopy/D&C with Dr. Pricilla Holm. Informed team she had a history of hemophilia/vWD. Surgery delayed and referral made to Hematology 03/05/2021: establish care with Dr. Leonides Schanz   CHIEF COMPLAINTS/PURPOSE OF CONSULTATION:  "Concern for von Willebrand Disease/Hemophilia "  HISTORY OF PRESENTING ILLNESS:  Mary Juarez 65 y.o. 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   On review of the previous records Mary Juarez was seen at [facility] on 03/24/2005 at which time she underwent an evaluation on concern for low Factor VIII. Her Factor VIII at that time was noted to be in the range of 51-59%, with her PTT in the 43-46 range. Lupus anticoagulant and von Willebrand panels were unremarkable at that time. No other record specifically provided lab information regarding her condition.  On exam today Mary Juarez's that she has a longstanding history of "hemophilia" that was initially diagnosed back in 2003.  This was diagnosed at [facility].  She notes that she was found to have an elevation in her PTT when undergoing work-up by her gynecologist for interstitial cystitis.  She reports that she was treated for 3 years or so at [facility] and received "IV iron or infusions".  She was also seen by Dr. Barry Dienes at [facility].  On further discussion she reports that she has never had any issues with major bleeding, bruising, or dark stools.  She has had dental procedures performed with no bleeding issues.  She went through menopause at age 70 and prior to menopause did not have particularly  heavy menstrual cycles.  She does endorse that she has been a vegetarian since the age of 40.  She also notes that she has no family history remarkable for bleeding or blood disorders.  She is of Jewish/German descent.  She reports that her maternal grandmother was diagnosed with leukemia in her 99s.  She otherwise is a never smoker never drinker and currently works at [employer].  She currently denies any fevers, chills, sweats, nausea, vomiting or diarrhea.  A full 10 point ROS is listed below.  MEDICAL HISTORY:  Past Medical History:  Diagnosis Date  . Anxiety   . Complication of anesthesia    per pt was very hypotentive w/ d&c hysteroscopy in 1990s in [place],  no issue the next surgery early 2000s don @[facility]   . Depression   . Frequency of urination   . Hemophilia, vascular (HCC)    Factor VIII defiency  . History of chest pain    evaulation by cardiologist,  dr ,  ekg normal, ETT 06-25-2015 normal (in epic),  atypical chest pain no further work-up  . History of concussion    per pt has had few times , last time at age 29,  no residual  . PMB (postmenopausal bleeding)   . Thickened endometrium   . Von Willebrand's disease (HCC)    Tyoe I  . Wears glasses     SURGICAL HISTORY: Past Surgical History:  Procedure Laterality Date  . CERVICAL POLYPECTOMY  early 2000s   dr 01-23-1974 @[facility]   . COLONOSCOPY  2012 approx.  DIAGNOSTIC LAPAROSCOPY  1970s   for painful menses  . DILATION AND CURETTAGE OF  UTERUS  1970s  . HYSTEROSCOPY WITH D & C  1990s  . NASAL FRACTURE SURGERY  age 53    SOCIAL HISTORY: Social History   Socioeconomic History  . Marital status: Married    Spouse name: Not on file  . Number of children: Not on file  . Years of education: Not on file  . Highest education level: Not on file  Occupational History  . Occupation: Conservation officer, nature at [employer]  Tobacco Use  . Smoking status: Never Smoker  . Smokeless tobacco: Never Used  Vaping Use  . Vaping Use: Never  used  Substance and Sexual Activity  . Alcohol use: No  . Drug use: Never  . Sexual activity: Not Currently    Birth control/protection: Post-menopausal  Other Topics Concern  . Not on file  Social History Narrative  . Not on file   Social Determinants of Health   Financial Resource Strain: Not on file  Food Insecurity: Not on file  Transportation Needs: Not on file  Physical Activity: Not on file  Stress: Not on file  Social Connections: Not on file  Intimate Partner Violence: Not on file    FAMILY HISTORY: Family History  Problem Relation Age of Onset  . Stroke Mother   . Heart disease Mother        pacemaker  . Heart attack Father   . Kidney failure Father   . Heart disease Maternal Grandmother        pacemaker  . Heart attack Maternal Grandfather   . Sudden death Maternal Grandfather   . Cancer Paternal Grandmother        oral - tobacco  . Heart attack Paternal Grandfather   . Sudden death Paternal Grandfather   . Heart disease Brother        secondary cardiomyopathy, PVCs, atherosclerosis of native artery, syncope & collapse - sees cardiologist    ALLERGIES:  is allergic to septra [sulfamethoxazole-trimethoprim], adhesive [tape], latex, and tamiflu [oseltamivir].  MEDICATIONS:  Current Outpatient Medications  Medication Sig Dispense Refill  . acetaminophen (TYLENOL) 500 MG tablet Take 500-1,000 mg by mouth every 6 (six) hours as needed for mild pain, moderate pain, fever or headache.    . calcium-vitamin D (OSCAL WITH D) 500-200 MG-UNIT per tablet Take 2 tablets by mouth at bedtime.    . clotrimazole-betamethasone (LOTRISONE) cream Apply topically 2 (two) times daily as needed.    . diclofenac Sodium (VOLTAREN) 1 % GEL 4 (four) times daily as needed.    . hydrocortisone 2.5 % cream Apply 1 application topically daily.    . Multiple Vitamins-Minerals (MULTIVITAMIN ADULT, MINERALS, PO) Take 1 tablet by mouth daily.    Marland Kitchen venlafaxine XR (EFFEXOR-XR) 37.5 MG 24 hr  capsule Take 37.5 mg by mouth at bedtime.     No current facility-administered medications for this visit.    REVIEW OF SYSTEMS:   Constitutional: ( - ) fevers, ( - )  chills , ( - ) night sweats Eyes: ( - ) blurriness of vision, ( - ) double vision, ( - ) watery eyes Ears, nose, mouth, throat, and face: ( - ) mucositis, ( - ) sore throat Respiratory: ( - ) cough, ( - ) dyspnea, ( - ) wheezes Cardiovascular: ( - ) palpitation, ( - ) chest discomfort, ( - ) lower extremity swelling Gastrointestinal:  ( - ) nausea, ( - ) heartburn, ( - ) change in bowel habits Skin: ( - ) abnormal skin rashes Lymphatics: ( - )  new lymphadenopathy, ( - ) easy bruising Neurological: ( - ) numbness, ( - ) tingling, ( - ) new weaknesses Behavioral/Psych: ( - ) mood change, ( - ) new changes  All other systems were reviewed with the patient and are negative.  PHYSICAL EXAMINATION: Vitals:   03/05/21 0900  BP: 114/66  Pulse: 72  Resp: 16  Temp: (!) 97.3 F (36.3 C)  SpO2: 99%   Filed Weights   03/05/21 0900  Weight: 148 lb 14.4 oz (67.5 kg)    GENERAL: well appearing middle aged Caucasian female in NAD  SKIN: skin color, texture, turgor are normal, no rashes or significant lesions EYES: conjunctiva are pink and non-injected, sclera clear LUNGS: clear to auscultation and percussion with normal breathing effort HEART: regular rate & rhythm and no murmurs and no lower extremity edema Musculoskeletal: no cyanosis of digits and no clubbing  PSYCH: alert & oriented x 3, fluent speech NEURO: no focal motor/sensory deficits  LABORATORY DATA:  I have reviewed the data as listed CBC Latest Ref Rng & Units 02/24/2021 03/17/2015  WBC 4.0 - 10.5 K/uL 5.5 7.8  Hemoglobin 12.0 - 15.0 g/dL 16.113.2 11.8(L)  Hematocrit 36.0 - 46.0 % 40.8 37.4  Platelets 150 - 400 K/uL 321 360    CMP Latest Ref Rng & Units 02/24/2021 03/17/2015  Glucose 70 - 99 mg/dL 97 98  BUN 8 - 23 mg/dL 14 09(U25(H)  Creatinine 0.450.44 - 1.00 mg/dL  4.090.65 8.110.56  Sodium 914135 - 145 mmol/L 136 139  Potassium 3.5 - 5.1 mmol/L 4.7 3.9  Chloride 98 - 111 mmol/L 103 104  CO2 22 - 32 mmol/L 29 26  Calcium 8.9 - 10.3 mg/dL 78.210.0 9.4  Total Protein 6.0 - 8.3 g/dL - 7.6  Total Bilirubin 0.3 - 1.2 mg/dL - 0.4  Alkaline Phos 39 - 117 U/L - 85  AST 0 - 37 U/L - 26  ALT 0 - 35 U/L - 25    RADIOGRAPHIC STUDIES: No results found.  ASSESSMENT & PLAN Mary Juarez 65 y.o. 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   After review the labs, the records, discussed with the patient the findings most consistent with an elevated PTT of little clinical significance.  Her labs do not show any signs or symptoms concerning for hemophilia or von Willebrand disease.  She did have a mild factor VIII deficiency at one point in time, but not enough to be clinically significant.  Today we will confirm our suspicions based off the prior labs by ordering full coagulation studies in order to rule out a hemophilia or von Willebrand's disease.  In the event the patient is found to have a true hemophilia would recommend referral to [facility] for her GYN procedure as they have a multidisciplinary hemophilia team which will be able to handle her care in the safest manner.  # Concern for von Willebrand Disease/Hemophilia --After review the labs and the prior records there is no clear sign that this patient has a true clinically significant hemophilia. --Patient does appear to have an elevation in her PTT, however in the absence of bleeding or other symptoms it is not likely to be of clinical significance. --Prior labs did show a mild factor VIII deficiency.  Today we will order full von Willebrand panel, von Willebrand multimers, PTT, PT, and factor VIII level. --In the event the patient is found to have a genuine  hemophilia would recommend that her procedure be performed at [facility]  due to his multidisciplinary hemophilia team. --Return to clinic on an as-needed basis.  Orders Placed This Encounter  Procedures  . CBC with Differential (Cancer Center Only)    Standing Status:   Future    Standing Expiration Date:   03/05/2022  . CMP (Cancer Center only)    Standing Status:   Future    Standing Expiration Date:   03/05/2022  . Von Willebrand panel    Standing Status:   Future    Standing Expiration Date:   03/05/2022  . Von Willebrand Multimeric    Standing Status:   Future    Standing Expiration Date:   03/05/2022  . APTT    Standing Status:   Future    Standing Expiration Date:   03/05/2022  . Protime-INR    Standing Status:   Future    Standing Expiration Date:   03/05/2022  . Platelet function assay    Standing Status:   Future    Standing Expiration Date:   03/05/2022    All questions were answered. The patient knows to call the clinic with any problems, questions or concerns.  A total of more than 60 minutes were spent on this encounter and over half of that time was spent on counseling and coordination of care as outlined above.   Ulysees Barns, MD Department of Hematology/Oncology [facility] Phone: [phone] Pager: [phone] Email: [email or web address]  03/05/2021 9:49 AM

## 2021-03-06 ENCOUNTER — Encounter: Payer: Self-pay | Admitting: Hematology and Oncology

## 2021-03-06 LAB — VON WILLEBRAND PANEL
Coagulation Factor VIII: 62 % (ref 56–140)
Ristocetin Co-factor, Plasma: 52 % (ref 50–200)
Von Willebrand Antigen, Plasma: 67 % (ref 50–200)

## 2021-03-06 LAB — COAG STUDIES INTERP REPORT

## 2021-03-09 ENCOUNTER — Telehealth: Payer: Self-pay

## 2021-03-09 ENCOUNTER — Encounter: Payer: Self-pay | Admitting: Gynecologic Oncology

## 2021-03-09 NOTE — Telephone Encounter (Signed)
Call back placed as requested. VM left for pt stating that MD Leonides Schanz will review her lab work when he is back and the office and follow-up with her results. [facility] phone number given for any further questions/concerns.

## 2021-03-09 NOTE — Telephone Encounter (Signed)
LM for Ms Kindler that Dr. Winferd Humphrey office is working on getting records from Dr.  Mathis Bud' office.  None received at this time. Dr. Pricilla Holm wanted to see if you would be able to r/s the surgery on 03-17-21. Please call back to the office to discuss surgery date.

## 2021-03-10 ENCOUNTER — Telehealth: Payer: Self-pay | Admitting: *Deleted

## 2021-03-10 ENCOUNTER — Other Ambulatory Visit: Payer: Self-pay | Admitting: Gynecologic Oncology

## 2021-03-10 ENCOUNTER — Encounter: Payer: Self-pay | Admitting: *Deleted

## 2021-03-10 DIAGNOSIS — R9389 Abnormal findings on diagnostic imaging of other specified body structures: Secondary | ICD-10-CM

## 2021-03-10 NOTE — Telephone Encounter (Signed)
Patient called - LVM - she has surgery scheduled on Tuesday 03/17/21. She is concerned that her APTT is elevated and wants to know if Dr. Leonides Schanz wants to repeat it prior to her surgery. In her call, she stated response could be left on her VM. Called her and LVM: Advised that Dr. Leonides Schanz is out of the office this week and that he will receive this message on his return. Message routed to Dr. Leonides Schanz and RN

## 2021-03-10 NOTE — Telephone Encounter (Signed)
Returned the patient's call and explained that her work note is in My Chart. Also scheduled the patient for COVID test on 3/25 at 8:35 am. Patient also given the appt for post op on 4/20 at 1:45 pm. Explained hat the office would call her back later today with the surgery time on 3/29; per patient ok to leave message on voicemail. Patient asked again about her labs for Dr Leonides Schanz, explained that he is out of the office for the rest of the week. Patient concerned of possible repeat of labs before surgery and asked if there is anyone covering for him. Explained that the message would be given to his nurse.

## 2021-03-10 NOTE — Telephone Encounter (Signed)
Patient called and wanted to confirm that she wanted surgery on 3/29. Patient stated "I wanted to know if we had a time for the surgery yet and I know I have had a lot of blood work done lately; would I need more done? I would also need another work note like before. I saw the las results from Dr Derek Mound labs; it looks like the APTT may need to be repeated, but is that a him question?"   Explained that Dr Pricilla Holm is in the office today in clinic and will ask her these questions. Explained that the office will call her back. Explained that all questions regarding Dr Derek Mound labs will need to go to his office.

## 2021-03-10 NOTE — Telephone Encounter (Signed)
Pt called back and spoke with Marcelino Duster wheat in scheduling. See her note dated 03-10-21.

## 2021-03-11 ENCOUNTER — Telehealth: Payer: Self-pay | Admitting: *Deleted

## 2021-03-11 NOTE — Telephone Encounter (Signed)
Fax records request to [facility] Dr Gypsy Lore and [facility] Dr Abbe Amsterdam for lab requests for Factor VIII and Von Willebrands dx.   Received records from [facility], only labs were pap smears. Received message from [facility], no labs drawn for those listed above

## 2021-03-12 ENCOUNTER — Telehealth: Payer: Self-pay | Admitting: *Deleted

## 2021-03-12 NOTE — Telephone Encounter (Signed)
Patient called and stated "Beth from Dr Derek Mound office just called me; I don't have to repeat any of my lab work and it all looked good. I also wanted to know what time my surgery is on next Tuesday." Explained that as of today her surgery is at 10:45 am with a two hour prior arrival. Patient verbalized understanding

## 2021-03-12 NOTE — Telephone Encounter (Signed)
TCT patient regarding her lab results. No answer but was able to leave vm message for patient to call back regarding her lab results at [phone]

## 2021-03-12 NOTE — Telephone Encounter (Signed)
-----   Message from Jaci Standard, MD sent at 03/11/2021  6:41 AM EDT ----- Regarding: FW: Pt's lab results Please let [name] know that her findings do not show any evidence of hemophilia or von Willebrand disease. She has a modest increase in her PTT that is not likely to increase her chances of bleeding. We do not need any labs re-drawn. Based off our current findings it should be safe to proceed with surgery.   ----- Message ----- From: Delena Bali, RN Sent: 03/09/2021   3:37 PM EDT To: Jaci Standard, MD, [facility] Subject: Pt's lab results                               Hi Dr. Leonides Schanz,   This patient called wanting the results of her recent lab work. She stated that she saw her results on mychart and is under the impression that her labs will have to be re-drawn.   I left her a VM stating that Dr. Derek Mound office will give her a call with her results once her labs have been reviewed.  Please advise. Thanks

## 2021-03-12 NOTE — Telephone Encounter (Signed)
Received call back from patient and advised thatb her labs are normal-no hemophilia or Von Willebrand disease.  Advised that she had a slight increase in her APTT but that this is not likely to increase her chances of bleeding during upcoming surgery. Advised that Dr. Leonides Schanz states is it ok to proceed with her surgery. Pt voiced understanding

## 2021-03-13 ENCOUNTER — Other Ambulatory Visit (HOSPITAL_COMMUNITY)
Admission: RE | Admit: 2021-03-13 | Discharge: 2021-03-13 | Disposition: A | Discharge status: Home / Self Care | Payer: No Typology Code available for payment source | Source: Ambulatory Visit | Attending: Gynecologic Oncology | Admitting: Gynecologic Oncology

## 2021-03-13 ENCOUNTER — Telehealth: Payer: Self-pay | Admitting: *Deleted

## 2021-03-13 ENCOUNTER — Encounter (HOSPITAL_BASED_OUTPATIENT_CLINIC_OR_DEPARTMENT_OTHER): Payer: Self-pay | Admitting: Gynecologic Oncology

## 2021-03-13 ENCOUNTER — Other Ambulatory Visit: Payer: Self-pay

## 2021-03-13 DIAGNOSIS — Z01812 Encounter for preprocedural laboratory examination: Secondary | ICD-10-CM | POA: Insufficient documentation

## 2021-03-13 DIAGNOSIS — Z20822 Contact with and (suspected) exposure to covid-19: Secondary | ICD-10-CM | POA: Diagnosis not present

## 2021-03-13 LAB — SARS CORONAVIRUS 2 (TAT 6-24 HRS): SARS Coronavirus 2: NEGATIVE

## 2021-03-13 NOTE — Telephone Encounter (Signed)
Returned the patient's call and left a message stating "You will receive a call on Monday from the pre surgical center and the office nurse regarding your surgery. Also records were obtained from Dr Abbe Amsterdam office, but they had no blood disease panels."

## 2021-03-13 NOTE — Progress Notes (Addendum)
Spoke w/ via phone for pre-op interview--- PT Lab needs dos----  T&S            Lab results---- pt had CBCdiff/ CMP/  done 03-05-2021 results in epic;  Also had current ekg in epic/ chart COVID test ------ 02-23-2021 @ 0855 Arrive at ------- 0800 on 03-17-2021 NPO after MN NO Solid Food.  Clear liquids from MN until--- 0700 Medications to take morning of surgery ----- NONE Diabetic medication ----- n/a Patient Special Instructions ----- n/a  Pre-Op special Istructions ----- this pt was rescheduled from 02-26-2021 due to question about blood disorders.  Pt had work-up done by oncology/ hematology, Dr Leonides Schanz, pt does not have hemophiliia/ Von Willebrand (all lab results and note in epic.  Patient verbalized understanding of instructions that were given at this phone interview. Patient denies shortness of breath, chest pain, fever, cough at this phone interview.

## 2021-03-16 ENCOUNTER — Telehealth: Payer: Self-pay | Admitting: *Deleted

## 2021-03-16 ENCOUNTER — Encounter: Payer: No Typology Code available for payment source | Admitting: Gynecologic Oncology

## 2021-03-16 NOTE — Telephone Encounter (Signed)
Patient called and rescheduled her post op appt from 4/20 to 4/22

## 2021-03-16 NOTE — Telephone Encounter (Signed)
Mary Juarez states that she understands her pre-op instructions for surgery.  No questions or concerns noted at this time.

## 2021-03-17 ENCOUNTER — Encounter (HOSPITAL_BASED_OUTPATIENT_CLINIC_OR_DEPARTMENT_OTHER)
Admission: RE | Disposition: A | Discharge status: Home / Self Care | Payer: Self-pay | Source: Home / Self Care | Attending: Gynecologic Oncology

## 2021-03-17 ENCOUNTER — Other Ambulatory Visit: Payer: Self-pay

## 2021-03-17 ENCOUNTER — Ambulatory Visit (HOSPITAL_BASED_OUTPATIENT_CLINIC_OR_DEPARTMENT_OTHER)
Admission: RE | Admit: 2021-03-17 | Discharge: 2021-03-17 | Disposition: A | Discharge status: Home / Self Care | Payer: No Typology Code available for payment source | Attending: Gynecologic Oncology | Admitting: Gynecologic Oncology

## 2021-03-17 ENCOUNTER — Ambulatory Visit (HOSPITAL_COMMUNITY)
Admission: RE | Admit: 2021-03-17 | Discharge: 2021-03-17 | Disposition: A | Discharge status: Home / Self Care | Payer: No Typology Code available for payment source | Source: Ambulatory Visit | Attending: Gynecologic Oncology | Admitting: Gynecologic Oncology

## 2021-03-17 ENCOUNTER — Encounter (HOSPITAL_BASED_OUTPATIENT_CLINIC_OR_DEPARTMENT_OTHER): Payer: Self-pay | Admitting: Gynecologic Oncology

## 2021-03-17 ENCOUNTER — Ambulatory Visit (HOSPITAL_BASED_OUTPATIENT_CLINIC_OR_DEPARTMENT_OTHER): Payer: No Typology Code available for payment source | Admitting: Anesthesiology

## 2021-03-17 DIAGNOSIS — N950 Postmenopausal bleeding: Secondary | ICD-10-CM | POA: Diagnosis not present

## 2021-03-17 DIAGNOSIS — Z8249 Family history of ischemic heart disease and other diseases of the circulatory system: Secondary | ICD-10-CM | POA: Insufficient documentation

## 2021-03-17 DIAGNOSIS — Z887 Allergy status to serum and vaccine status: Secondary | ICD-10-CM | POA: Diagnosis not present

## 2021-03-17 DIAGNOSIS — R9389 Abnormal findings on diagnostic imaging of other specified body structures: Secondary | ICD-10-CM

## 2021-03-17 DIAGNOSIS — D680 Von Willebrand's disease: Secondary | ICD-10-CM | POA: Diagnosis not present

## 2021-03-17 DIAGNOSIS — Z809 Family history of malignant neoplasm, unspecified: Secondary | ICD-10-CM | POA: Diagnosis not present

## 2021-03-17 DIAGNOSIS — Z91040 Latex allergy status: Secondary | ICD-10-CM | POA: Diagnosis not present

## 2021-03-17 DIAGNOSIS — N840 Polyp of corpus uteri: Secondary | ICD-10-CM | POA: Insufficient documentation

## 2021-03-17 DIAGNOSIS — Z79899 Other long term (current) drug therapy: Secondary | ICD-10-CM | POA: Diagnosis not present

## 2021-03-17 DIAGNOSIS — Z881 Allergy status to other antibiotic agents status: Secondary | ICD-10-CM | POA: Diagnosis not present

## 2021-03-17 HISTORY — PX: OPERATIVE ULTRASOUND: SHX5996

## 2021-03-17 HISTORY — PX: DILATATION & CURETTAGE/HYSTEROSCOPY WITH MYOSURE: SHX6511

## 2021-03-17 HISTORY — DX: Acquired coagulation factor deficiency: D68.4

## 2021-03-17 LAB — TYPE AND SCREEN
ABO/RH(D): O POS
Antibody Screen: NEGATIVE

## 2021-03-17 SURGERY — DILATATION & CURETTAGE/HYSTEROSCOPY WITH MYOSURE
Anesthesia: General | Site: Abdomen

## 2021-03-17 MED ORDER — ACETAMINOPHEN 500 MG PO TABS
ORAL_TABLET | ORAL | Status: AC
  Filled 2021-03-17: qty 2

## 2021-03-17 MED ORDER — LIDOCAINE 2% (20 MG/ML) 5 ML SYRINGE
INTRAMUSCULAR | Status: DC | PRN
  Administered 2021-03-17: 60 mg via INTRAVENOUS

## 2021-03-17 MED ORDER — ONDANSETRON HCL 4 MG/2ML IJ SOLN
INTRAMUSCULAR | Status: AC
  Filled 2021-03-17: qty 2

## 2021-03-17 MED ORDER — SODIUM CHLORIDE 0.9 % IR SOLN
Status: DC | PRN
  Administered 2021-03-17: 3000 mL

## 2021-03-17 MED ORDER — KETOROLAC TROMETHAMINE 30 MG/ML IJ SOLN
INTRAMUSCULAR | Status: AC
  Filled 2021-03-17: qty 1

## 2021-03-17 MED ORDER — ACETAMINOPHEN 500 MG PO TABS
1000.0000 mg | ORAL_TABLET | ORAL | Status: AC
  Administered 2021-03-17: 1000 mg via ORAL

## 2021-03-17 MED ORDER — KETOROLAC TROMETHAMINE 30 MG/ML IJ SOLN: 30.0000 mg | Freq: Once | INTRAMUSCULAR | Status: DC | PRN

## 2021-03-17 MED ORDER — SCOPOLAMINE 1 MG/3DAYS TD PT72
MEDICATED_PATCH | TRANSDERMAL | Status: AC
  Filled 2021-03-17: qty 1

## 2021-03-17 MED ORDER — MIDAZOLAM HCL 5 MG/5ML IJ SOLN
INTRAMUSCULAR | Status: DC | PRN
  Administered 2021-03-17 (×2): 1 mg via INTRAVENOUS

## 2021-03-17 MED ORDER — ONDANSETRON HCL 4 MG PO TABS: 4.0000 mg | ORAL_TABLET | Freq: Four times a day (QID) | ORAL | Status: DC | PRN

## 2021-03-17 MED ORDER — MIDAZOLAM HCL 2 MG/2ML IJ SOLN
INTRAMUSCULAR | Status: AC
  Filled 2021-03-17: qty 2

## 2021-03-17 MED ORDER — HYDROMORPHONE HCL 1 MG/ML IJ SOLN
0.2500 mg | INTRAMUSCULAR | Status: DC | PRN
Start: 2021-03-17 — End: 2021-03-17

## 2021-03-17 MED ORDER — FENTANYL CITRATE (PF) 100 MCG/2ML IJ SOLN
INTRAMUSCULAR | Status: DC | PRN
  Administered 2021-03-17: 50 ug via INTRAVENOUS
  Administered 2021-03-17 (×2): 25 ug via INTRAVENOUS

## 2021-03-17 MED ORDER — EPHEDRINE SULFATE 50 MG/ML IJ SOLN
INTRAMUSCULAR | Status: DC | PRN
  Administered 2021-03-17: 10 mg via INTRAVENOUS
  Administered 2021-03-17: 15 mg via INTRAVENOUS
  Administered 2021-03-17: 5 mg via INTRAVENOUS
  Administered 2021-03-17 (×2): 10 mg via INTRAVENOUS

## 2021-03-17 MED ORDER — PROPOFOL 10 MG/ML IV BOLUS
INTRAVENOUS | Status: DC | PRN
  Administered 2021-03-17: 130 mg via INTRAVENOUS
  Administered 2021-03-17: 70 mg via INTRAVENOUS

## 2021-03-17 MED ORDER — LIDOCAINE 2% (20 MG/ML) 5 ML SYRINGE
INTRAMUSCULAR | Status: AC
  Filled 2021-03-17: qty 5

## 2021-03-17 MED ORDER — FENTANYL CITRATE (PF) 100 MCG/2ML IJ SOLN
INTRAMUSCULAR | Status: AC
  Filled 2021-03-17: qty 2

## 2021-03-17 MED ORDER — DEXAMETHASONE SODIUM PHOSPHATE 10 MG/ML IJ SOLN
INTRAMUSCULAR | Status: AC
  Filled 2021-03-17: qty 1

## 2021-03-17 MED ORDER — SCOPOLAMINE 1 MG/3DAYS TD PT72
1.0000 | MEDICATED_PATCH | TRANSDERMAL | Status: DC
  Administered 2021-03-17: 1.5 mg via TRANSDERMAL

## 2021-03-17 MED ORDER — OXYCODONE HCL 5 MG/5ML PO SOLN: 5.0000 mg | Freq: Once | ORAL | Status: DC | PRN

## 2021-03-17 MED ORDER — ONDANSETRON HCL 4 MG/2ML IJ SOLN: 4.0000 mg | Freq: Four times a day (QID) | INTRAMUSCULAR | Status: DC | PRN

## 2021-03-17 MED ORDER — TRAMADOL HCL 50 MG PO TABS: 50.0000 mg | ORAL_TABLET | Freq: Four times a day (QID) | ORAL | Status: DC | PRN

## 2021-03-17 MED ORDER — PROPOFOL 10 MG/ML IV BOLUS
INTRAVENOUS | Status: AC
  Filled 2021-03-17: qty 20

## 2021-03-17 MED ORDER — PROMETHAZINE HCL 25 MG/ML IJ SOLN: 6.2500 mg | INTRAMUSCULAR | Status: DC | PRN

## 2021-03-17 MED ORDER — LACTATED RINGERS IV SOLN: INTRAVENOUS | Status: DC

## 2021-03-17 MED ORDER — CHLOROPROCAINE HCL 1 % IJ SOLN
INTRAMUSCULAR | Status: DC | PRN
  Administered 2021-03-17: 10 mL

## 2021-03-17 MED ORDER — MEPERIDINE HCL 25 MG/ML IJ SOLN: 6.2500 mg | INTRAMUSCULAR | Status: DC | PRN

## 2021-03-17 MED ORDER — DEXAMETHASONE SODIUM PHOSPHATE 10 MG/ML IJ SOLN
INTRAMUSCULAR | Status: DC | PRN
  Administered 2021-03-17: 10 mg via INTRAVENOUS

## 2021-03-17 MED ORDER — KETOROLAC TROMETHAMINE 30 MG/ML IJ SOLN
INTRAMUSCULAR | Status: DC | PRN
  Administered 2021-03-17: 30 mg via INTRAVENOUS

## 2021-03-17 MED ORDER — ONDANSETRON HCL 4 MG/2ML IJ SOLN
INTRAMUSCULAR | Status: DC | PRN
  Administered 2021-03-17: 4 mg via INTRAVENOUS

## 2021-03-17 MED ORDER — OXYCODONE HCL 5 MG PO TABS: 5.0000 mg | ORAL_TABLET | Freq: Once | ORAL | Status: DC | PRN

## 2021-03-17 SURGICAL SUPPLY — 19 items
CATH SILICONE 16FRX5CC (CATHETERS) ×3 IMPLANT
COVER WAND RF STERILE (DRAPES) ×3 IMPLANT
DEVICE MYOSURE REACH (MISCELLANEOUS) ×3 IMPLANT
GLOVE SRG 8 PF TXTR STRL LF DI (GLOVE) ×2 IMPLANT
GLOVE SURG POLYISO LF SZ6 (GLOVE) ×3 IMPLANT
GLOVE SURG UNDER POLY LF SZ6.5 (GLOVE) ×3 IMPLANT
GLOVE SURG UNDER POLY LF SZ7 (GLOVE) ×3 IMPLANT
GLOVE SURG UNDER POLY LF SZ8 (GLOVE) ×3
GOWN STRL REUS W/TWL LRG LVL3 (GOWN DISPOSABLE) ×3 IMPLANT
IV NS IRRIG 3000ML ARTHROMATIC (IV SOLUTION) ×3 IMPLANT
KIT TURNOVER CYSTO (KITS) ×3 IMPLANT
NS IRRIG 500ML POUR BTL (IV SOLUTION) ×3 IMPLANT
PACK VAGINAL MINOR WOMEN LF (CUSTOM PROCEDURE TRAY) ×3 IMPLANT
PAD OB MATERNITY 4.3X12.25 (PERSONAL CARE ITEMS) ×3 IMPLANT
PAD PREP 24X48 CUFFED NSTRL (MISCELLANEOUS) ×3 IMPLANT
PLUG CATH AND CAP STER (CATHETERS) ×3 IMPLANT
SEAL ROD LENS SCOPE MYOSURE (ABLATOR) ×3 IMPLANT
SYR BULB IRRIG 60ML STRL (SYRINGE) ×3 IMPLANT
TOWEL OR 17X26 10 PK STRL BLUE (TOWEL DISPOSABLE) ×3 IMPLANT

## 2021-03-17 NOTE — Interval H&P Note (Signed)
History and Physical Interval Note: Patient has now seen heme/onc. No significant lab abnormalities and no confirmed diagnosis of either hemophilia or Factor V.  03/17/2021 9:46 AM  Mary Juarez  has presented today for surgery, with the diagnosis of POST MENOPAUSAL BLEEDING,THICKENED ENDOMETRIUM.  The various methods of treatment have been discussed with the patient and family. After consideration of risks, benefits and other options for treatment, the patient has consented to  Procedure(s): DILATATION & CURETTAGE/HYSTEROSCOPY WITH MYOSURE (N/A) POSSIBLE OPERATIVE ULTRASOUND (N/A) as a surgical intervention.  The patient's history has been reviewed, patient examined, no change in status, stable for surgery.  I have reviewed the patient's chart and labs.  Questions were answered to the patient's satisfaction.     Carver Fila

## 2021-03-17 NOTE — Discharge Instructions (Addendum)
Dilation and Curettage or Vacuum Curettage, Care After  This sheet gives you information about how to care for yourself after your procedure. Your doctor may also give you more specific instructions. If you have problems or questions, contact your doctor. What can I expect after the procedure? After the procedure, it is common to have:  Mild pain or cramping.  Some bleeding or spotting from the vagina. These may last for up to 2 weeks. Follow these instructions at home: Medicines  Take over-the-counter and prescription medicines only as told by your doctor. This is very important if you take blood-thinning medicine.  Ask your doctor if the medicine prescribed to you requires you to avoid driving or using machinery. Activity  If you were given a medicine to help you relax (sedative) during your procedure, it can affect you for many hours. Do not drive or use machinery until your doctor says that it is safe.  Rest as told by your doctor.  Do not sit for a long time without moving. Get up to take short walks every 1-2 hours. This is important. Ask for help if you feel weak or unsteady.  Do not lift anything that is heavier than 10 lb (4.5 kg), or the limit that you are told, until your doctor says that it is safe.  Return to your normal activities as told by your doctor. Ask your doctor what activities are safe for you.   Lifestyle For at least 4 weeks, or as long as told by your doctor:  Do not douche.  Do not use tampons.  Do not have sex. General instructions  Wear compression stockings as told by your doctor.  It is up to you to get the results of your procedure. Ask your doctor, or the department that is doing the procedure, when your results will be ready.  Keep all follow-up visits as told by your doctor. This is important. Contact a doctor if:  You have very bad cramps that get worse or do not get better with medicine.  You have very bad pain in your belly  (abdomen).  You cannot drink fluids without vomiting.  You have pain in a different part of your pelvis. The pelvis is the area just above your thighs.  You have fluid from your vagina that smells bad.  You have a rash. Get help right away if:  You are bleeding a lot from your vagina. A lot of bleeding means soaking more than one sanitary pad in 1 hour for 2 hours in a row.  You have a fever that is above 100.56F (38.0C).  Your belly feels very tender or hard.  You have chest pain.  You have trouble breathing.  You feel dizzy.  You feel light-headed.  You pass out (faint).  You have pain in your neck or shoulder area. These symptoms may be an emergency. Do not wait to see if the symptoms will go away. Get medical help right away. Call your local emergency services (911 in the [place] Do not drive yourself to the hospital. Summary  After your procedure, it is common to have pain or cramping. It is also common to have bleeding or spotting from your vagina.  Rest as told. Do not sit for a long time without moving. Get up to take short walks every 1-2 hours.  Do not lift anything that is heavier than 10 lb (4.5 kg), or the limit that you are told.  Contact your doctor if you have fluid  from your vagina that smells bad.  Get help right away if you develop any problems from the procedure. Ask your doctor what problems to watch for. This information is not intended to replace advice given to you by your health care provider. Make sure you discuss any questions you have with your health care provider. Document Revised: 01/08/2020 Document Reviewed: 01/08/2020 Elsevier Patient Education  2021 Elsevier Inc.  Reasons to call the Doctor:  Fever - Oral temperature greater than 100.4 degrees Fahrenheit  Foul-smelling vaginal discharge  Difficulty urinating  Nausea and vomiting  Increased pain at the site of the incision that is unrelieved with pain medicine.  Difficulty  breathing with or without chest pain  New calf pain especially if only on one side  Sudden, continuing increased vaginal bleeding with or without clots.   Contacts: For questions or concerns you should contact:  Dr. Eugene Garnet at [phone]  Warner Mccreedy, NP at [phone]  After Hours: call [phone] and have the GYN Oncologist paged/contacted   Post Anesthesia Home Care Instructions   If you are able to take Ibuprofen, you may begin it after 4:30 pm today    Activity: Get plenty of rest for the remainder of the day. A responsible adult should stay with you for 24 hours following the procedure.  For the next 24 hours, DO NOT: -Drive a car -Advertising copywriter -Drink alcoholic beverages -Take any medication unless instructed by your physician -Make any legal decisions or sign important papers.  Meals: Start with liquid foods such as gelatin or soup. Progress to regular foods as tolerated. Avoid greasy, spicy, heavy foods. If nausea and/or vomiting occur, drink only clear liquids until the nausea and/or vomiting subsides. Call your physician if vomiting continues.  Special Instructions/Symptoms: Your throat may feel dry or sore from the anesthesia or the breathing tube placed in your throat during surgery. If this causes discomfort, gargle with warm salt water. The discomfort should disappear within 24 hours.  If you had a scopolamine patch placed behind your ear for the management of post- operative nausea and/or vomiting:  1. The medication in the patch is effective for 72 hours, after which it should be removed.  Wrap patch in a tissue and discard in the trash. Wash hands thoroughly with soap and water. 2. You may remove the patch earlier than 72 hours if you experience unpleasant side effects which may include dry mouth, dizziness or visual disturbances. 3. Avoid touching the patch. Wash your hands with soap and water after contact with the patch.

## 2021-03-17 NOTE — Anesthesia Preprocedure Evaluation (Addendum)
Anesthesia Evaluation  Patient identified by MRN, date of birth, ID band Patient awake    Reviewed: Allergy & Precautions, NPO status , Patient's Chart, lab work & pertinent test results  History of Anesthesia Complications Negative for: history of anesthetic complications  Airway Mallampati: II  TM Distance: >3 FB Neck ROM: Full    Dental no notable dental hx. (+) Dental Advisory Given   Pulmonary neg pulmonary ROS,    Pulmonary exam normal breath sounds clear to auscultation       Cardiovascular negative cardio ROS Normal cardiovascular exam Rhythm:Regular Rate:Normal  Stress test 2016 normal   Neuro/Psych PSYCHIATRIC DISORDERS Anxiety Depression negative neurological ROS     GI/Hepatic negative GI ROS, Neg liver ROS,   Endo/Other  negative endocrine ROS  Renal/GU negative Renal ROS  Female GU complaint (AUB)     Musculoskeletal negative musculoskeletal ROS (+)   Abdominal   Peds  Hematology  (+) Blood dyscrasia, , hct 38.2 Pt was originally scheduled for 3/17, 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, VWD) that was not documented anywhere else- surgery postponed, pt sent to hematologist- normal levels, slightly elevated ptt at 40 (upper limit 36)   Anesthesia Other Findings   Reproductive/Obstetrics negative OB ROS                            Anesthesia Physical Anesthesia Plan  ASA: I  Anesthesia Plan: General   Post-op Pain Management:    Induction: Intravenous  PONV Risk Score and Plan: 4 or greater and Ondansetron, Dexamethasone, Midazolam and Treatment may vary due to age or medical condition  Airway Management Planned: LMA  Additional Equipment: None  Intra-op Plan:   Post-operative Plan: Extubation in OR  Informed Consent: I have reviewed the patients History and Physical, chart, labs and discussed the procedure including the risks, benefits and alternatives for  the proposed anesthesia with the patient or authorized representative who has indicated his/her understanding and acceptance.     Dental advisory given  Plan Discussed with: CRNA  Anesthesia Plan Comments:         Anesthesia Quick Evaluation

## 2021-03-17 NOTE — Interval H&P Note (Signed)
History and Physical Interval Note:  03/17/2021 9:54 AM  Mary Juarez  has presented today for surgery, with the diagnosis of POST MENOPAUSAL BLEEDING,THICKENED ENDOMETRIUM.  The various methods of treatment have been discussed with the patient and family. After consideration of risks, benefits and other options for treatment, the patient has consented to  Procedure(s): DILATATION & CURETTAGE/HYSTEROSCOPY WITH MYOSURE (N/A) POSSIBLE OPERATIVE ULTRASOUND (N/A) as a surgical intervention.  The patient's history has been reviewed, patient examined, no change in status, stable for surgery.  I have reviewed the patient's chart and labs.  Questions were answered to the patient's satisfaction.     Carver Fila

## 2021-03-17 NOTE — Op Note (Signed)
PATIENT: Mary Juarez DATE: 03/17/21  Preop Diagnosis: PMB  Postoperative Diagnosis: same  Surgery: Hysteroscopy, D&C (dilation and curettage) using the myosure device and ultrasound guidance  Surgeons: Eugene Garnet  Anesthesia: General   Estimated blood loss: 82ml  IVF:  See I&O flowsheet   Urine output: minimal ml   Fluid deficit: 170cc  Complications: None apparent   Pathology: endometrial curetteings  Operative findings: Small mobile uterus, retroverted. No adnexal masses.On hysteroscopy, narrow and atrophic endocervical canal, atrophic endometrium.  Procedure: The patient was identified in the preoperative holding area. Informed consent was signed on the chart. Patient was seen history was reviewed and exam was performed.   The patient was then taken to the operating room and placed in the supine position with SCD hose on. General anesthesia was then induced without difficulty. She was then placed in the dorsolithotomy position. The perineum was prepped with Betadine. The vagina was prepped with Betadine. The patient was then draped after the prep was dried. A foley was used to backfill the bladder for ultrasound guidance with 150cc sterile saline under aseptic conditions.  Timeout was performed the patient, procedure, antibiotic, allergy, and length of procedure.   The speculum was placed in the posterior vagina. The single tooth tenaculum was placed on the anterior lip of the cervix. Under ultrasound guidance, the uterine sound was placed in the cervix and advanced to the fundus (6cm). The cervix was successively dilated using pratts dilators to 41F.  The Myosure hysteroscopy was introduced into the endometrial cavity. Pictures were taken. The Myosure reach was then used to perform sampling of the atrophic endometrial cavity and the endocervix.   The specimen was collected in the specimen collecting system sent for permanent pathology.  The tenaculum  was removed and hemostasis was observed.   The vagina was irrigated.  All instrument, suture, laparotomy, Ray-Tec, and needle counts were correct x2. Bladder was emptied before the catheter removed.   The patient tolerated the procedure well and was taken recovery room in stable condition.  Eugene Garnet MD Gynecologic Oncology

## 2021-03-17 NOTE — Anesthesia Postprocedure Evaluation (Signed)
Anesthesia Post Note  Patient: Khristi Schiller  Procedure(s) Performed: DILATATION & CURETTAGE/HYSTEROSCOPY WITH MYOSURE (N/A ) OPERATIVE ULTRASOUND (N/A Abdomen)     Patient location during evaluation: PACU Anesthesia Type: General Level of consciousness: awake and alert, oriented and patient cooperative Pain management: pain level controlled Vital Signs Assessment: post-procedure vital signs reviewed and stable Respiratory status: spontaneous breathing, nonlabored ventilation and respiratory function stable Cardiovascular status: blood pressure returned to baseline and stable Postop Assessment: no apparent nausea or vomiting Anesthetic complications: no   No complications documented.  Last Vitals:  Vitals:   03/17/21 1130 03/17/21 1200  BP: 121/84 (!) 123/55  Pulse: 71 66  Resp: 14 16  Temp:  (!) 36.3 C  SpO2: 99% 100%    Last Pain:  Vitals:   03/17/21 1200  TempSrc:   PainSc: 0-No pain                 Lannie Fields

## 2021-03-17 NOTE — Transfer of Care (Signed)
Immediate Anesthesia Transfer of Care Note  Patient: Mary Juarez  Procedure(s) Performed: Procedure(s) (LRB): DILATATION & CURETTAGE/HYSTEROSCOPY WITH MYOSURE (N/A) OPERATIVE ULTRASOUND (N/A)  Patient Location: PACU  Anesthesia Type: General  Level of Consciousness: awake, alert  and oriented  Airway & Oxygen Therapy: Patient Spontanous Breathing and Patient connected to nasal cannula oxygen  Post-op Assessment: Report given to PACU RN and Post -op Vital signs reviewed and stable  Post vital signs: Reviewed and stable  Complications: No apparent anesthesia complications Last Vitals:  Vitals Value Taken Time  BP 153/67 03/17/21 1045  Temp    Pulse 80 03/17/21 1048  Resp 14 03/17/21 1048  SpO2 100 % 03/17/21 1048  Vitals shown include unvalidated device data.  Last Pain:  Vitals:   03/17/21 0830  TempSrc: Oral  PainSc: 0-No pain      Patients Stated Pain Goal: 3 (03/17/21 0830)  Complications: No complications documented.

## 2021-03-17 NOTE — Anesthesia Procedure Notes (Signed)
Procedure Name: LMA Insertion Date/Time: 03/17/2021 10:01 AM Performed by: Norva Pavlov, CRNA Pre-anesthesia Checklist: Patient identified, Emergency Drugs available, Suction available and Patient being monitored Patient Re-evaluated:Patient Re-evaluated prior to induction Oxygen Delivery Method: Circle system utilized Preoxygenation: Pre-oxygenation with 100% oxygen Induction Type: IV induction Ventilation: Mask ventilation without difficulty LMA: LMA inserted LMA Size: 4.0 Number of attempts: 1 Airway Equipment and Method: Bite block Placement Confirmation: positive ETCO2 Tube secured with: Tape Dental Injury: Teeth and Oropharynx as per pre-operative assessment  Comments: Extremely limited mouth opening. Removed all air from LMA 4 cuff before inserting. Seated well.

## 2021-03-18 ENCOUNTER — Telehealth: Payer: Self-pay | Admitting: *Deleted

## 2021-03-18 ENCOUNTER — Encounter (HOSPITAL_BASED_OUTPATIENT_CLINIC_OR_DEPARTMENT_OTHER): Payer: Self-pay | Admitting: Gynecologic Oncology

## 2021-03-18 LAB — SURGICAL PATHOLOGY

## 2021-03-18 NOTE — Telephone Encounter (Signed)
Refax request for records from Dr Abbe Amsterdam office for blood dx panels, receive fax that there was no results

## 2021-03-18 NOTE — Telephone Encounter (Signed)
Ms. Kawashima called this morning. She states that she is doing very well after her surgery. She reports some scant bleeding. She is eating, drinking urinating without difficulty. She denies pain. She has not had a BM or passed gas yet, but has medication at home available to her that she takes as needed. We discussed that her pathology was sent and that we would let her know the results when they became available. Pt is aware of her f/u visit w/ Dr. Pricilla Holm.

## 2021-03-23 ENCOUNTER — Telehealth: Payer: Self-pay | Admitting: *Deleted

## 2021-03-23 NOTE — Telephone Encounter (Signed)
LM for Mary Juarez stating that Warner Mccreedy, NP said that bleeding can occur with some straining.  Increase stool softeners as recommended. Call the office if she has any bleeding like a period prior to her post op appointment.

## 2021-03-23 NOTE — Telephone Encounter (Signed)
Spoke with Ms Hauk and she stated that she did strain a little with BM. Stool was a little hard.  Suggested that she use stool softeners daily. The bleeding was one time after BM. She did bleed through a pad into a depends at after the BM. She has had some light vaginal spotting with some dark brown blood in the last 24 hrs.  Will review with Warner Mccreedy, NP and call her with recommendations.

## 2021-03-23 NOTE — Telephone Encounter (Signed)
Patient called back and left a message to move her appt to 4/15. Called the patient and left a message with the new appt ofd 4/14 at 1:30pm, explained that 4/15 not available

## 2021-03-23 NOTE — Telephone Encounter (Signed)
Returned the patient's call and left a message that the office can move her post op appt from 4/22 to either 4/20 or 4/15

## 2021-03-23 NOTE — Telephone Encounter (Signed)
Patient called and left a message stating "I was doing well post op until yesterday morning. I had a bowel movement and a lot of blood with it. I stayed off my feet and it got better. Is this normal?"  Message given to the desk nurse

## 2021-04-01 ENCOUNTER — Telehealth: Payer: Self-pay | Admitting: *Deleted

## 2021-04-01 NOTE — Telephone Encounter (Signed)
PC to patient regarding meaningful use questions for her MD visit tomorrow.  Left message for patient to call office [phone].

## 2021-04-02 ENCOUNTER — Inpatient Hospital Stay: Payer: No Typology Code available for payment source | Attending: Gynecologic Oncology | Admitting: Gynecologic Oncology

## 2021-04-02 ENCOUNTER — Other Ambulatory Visit: Payer: Self-pay

## 2021-04-02 ENCOUNTER — Encounter: Payer: Self-pay | Admitting: Gynecologic Oncology

## 2021-04-02 VITALS — BP 125/59 | HR 69 | Temp 96.5°F | Resp 18 | Wt 145.4 lb

## 2021-04-02 DIAGNOSIS — N840 Polyp of corpus uteri: Secondary | ICD-10-CM

## 2021-04-02 DIAGNOSIS — Z9889 Other specified postprocedural states: Secondary | ICD-10-CM

## 2021-04-02 DIAGNOSIS — N950 Postmenopausal bleeding: Secondary | ICD-10-CM

## 2021-04-02 LAB — VON WILLEBRAND FACTOR MULTIMER

## 2021-04-02 NOTE — Progress Notes (Signed)
Gynecologic Oncology Return Clinic Visit  04/02/2021  Reason for Visit: Follow-up after recent surgery  Treatment History: Patient underwent hysteroscopy with MyoSure sampling on 3/29 in the setting of postmenopausal bleeding.  Pathology revealed benign endometrial polyp, atrophic endometrium, no hyperplasia or malignancy  Interval History: Patient presents today for follow-up.  She is doing very well since her surgery.  She had one episode of bleeding within several days after surgery when she strains to have a bowel movement.  She denies any vaginal bleeding or discharge since.  She reports bowel function is back to her normal, she is using Dulcolax as needed per gastroenterology's recommendations.  She reports a good appetite without any nausea or emesis.  She denies any fevers or chills.  She reports regular urinary function.  Past Medical/Surgical History: Past Medical History:  Diagnosis Date  . Anxiety   . 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 Complication of anesthesia    per pt was very hypotensive w/ d&c hysteroscopy in 1990s in [place],  no issue the next surgery early 2000s don @[facility]   . Depression   . Frequency of urination   . History of chest pain    evaulation by cardiologist,  dr ,  ekg normal, ETT 06-25-2015 normal (in epic),  atypical chest pain no further work-up  . History of concussion    per pt has had few times , last time at age 63,  no residual  . PMB (postmenopausal bleeding)   . Thickened endometrium   . Wears glasses     Past Surgical History:  Procedure Laterality Date  . CERVICAL POLYPECTOMY  early 2000s   dr 01-23-1974 @[facility]   . COLONOSCOPY  2012 approx.  DIAGNOSTIC LAPAROSCOPY  1970s   for painful menses  . DILATATION & CURETTAGE/HYSTEROSCOPY WITH MYOSURE N/A 03/17/2021   Procedure: DILATATION & CURETTAGE/HYSTEROSCOPY WITH MYOSURE;  Surgeon: Marland Kitchen, MD;  Location: [facility];  Service:  Gynecology;  Laterality: N/A;  . DILATION AND CURETTAGE OF UTERUS  1970s  . HYSTEROSCOPY WITH D & C  1990s  . NASAL FRACTURE SURGERY  age 42  . OPERATIVE ULTRASOUND N/A 03/17/2021   Procedure: OPERATIVE ULTRASOUND;  Surgeon: 12, MD;  Location: [facility];  Service: Gynecology;  Laterality: N/A;    Family History  Problem Relation Age of Onset  . Stroke Mother   . Heart disease Mother        pacemaker  . Heart attack Father   . Kidney failure Father   . Heart disease Maternal Grandmother        pacemaker  . Heart attack Maternal Grandfather   . Sudden death Maternal Grandfather   . Cancer Paternal Grandmother        oral - tobacco  . Heart attack Paternal Grandfather   . Sudden death Paternal Grandfather   . Heart disease Brother        secondary cardiomyopathy, PVCs, atherosclerosis of native artery, syncope & collapse - sees cardiologist    Social History   Socioeconomic History  . Marital status: Married    Spouse name: Not on file  . Number of children: Not on file  . Years of education: Not on file  . Highest education level: Not on file  Occupational History  . 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  Tobacco Use  . Smoking status: Never Smoker  . Smokeless tobacco: Never Used  Vaping Use  . Vaping  Use: Never used  Substance and Sexual Activity  . Alcohol use: No  . Drug use: Never  . Sexual activity: Not Currently    Birth control/protection: Post-menopausal  Other Topics Concern  . Not on file  Social History Narrative  . Not on file   Social Determinants of Health   Financial Resource Strain: Not on file  Food Insecurity: Not on file  Transportation Needs: Not on file  Physical Activity: Not on file  Stress: Not on file  Social Connections: Not on file    Current Medications:  Current Outpatient Medications:  .  acetaminophen (TYLENOL) 500 MG tablet, Take 500-1,000 mg by mouth every 6 (six) hours as needed for mild  pain, moderate pain, fever or headache., Disp: , Rfl:  .  clotrimazole-betamethasone (LOTRISONE) cream, Apply topically 2 (two) times daily as needed., Disp: , Rfl:  .  Multiple Vitamins-Minerals (MULTIVITAMIN ADULT, MINERALS, PO), Take 1 tablet by mouth daily., Disp: , Rfl:  .  venlafaxine XR (EFFEXOR-XR) 37.5 MG 24 hr capsule, Take 37.5 mg by mouth at bedtime., Disp: , Rfl:  .  calcium-vitamin D (OSCAL WITH D) 500-200 MG-UNIT per tablet, Take 2 tablets by mouth at bedtime. (Patient not taking: Reported on 04/01/2021), Disp: , Rfl:  .  diclofenac Sodium (VOLTAREN) 1 % GEL, 4 (four) times daily as needed. (Patient not taking: No sig reported), Disp: , Rfl:  .  hydrocortisone 2.5 % cream, Apply 1 application topically daily. (Patient not taking: Reported on 04/01/2021), Disp: , Rfl:   Review of Systems: Denies appetite changes, fevers, chills, fatigue, unexplained weight changes. Denies hearing loss, neck lumps or masses, mouth sores, ringing in ears or voice changes. Denies cough or wheezing.  Denies shortness of breath. Denies chest pain or palpitations. Denies leg swelling. Denies abdominal distention, pain, blood in stools, constipation, diarrhea, nausea, vomiting, or early satiety. Denies pain with intercourse, dysuria, frequency, hematuria or incontinence. Denies hot flashes, pelvic pain, vaginal bleeding or vaginal discharge.   Denies joint pain, back pain or muscle pain/cramps. Denies itching, rash, or wounds. Denies dizziness, headaches, numbness or seizures. Denies swollen lymph nodes or glands, denies easy bruising or bleeding. Denies anxiety, depression, confusion, or decreased concentration.  Physical Exam: BP (!) 125/59 (BP Location: Right Arm, Patient Position: Sitting)   Pulse 69   Temp (!) 96.5 F (35.8 C) (Tympanic)   Resp 18   Wt 145 lb 7 oz (66 kg)   SpO2 98%   BMI 24.96 kg/m  General: Alert, oriented, no acute distress. HEENT: Normocephalic, atraumatic, sclera  anicteric. Chest: Unlabored breathing on room air.  Laboratory & Radiologic Studies: None new  Assessment & Plan: Mary Juarez is a 65 y.o. woman with postmenopausal bleeding now approximately 3 weeks status post hysteroscopy and endometrial sampling with benign endometrial polyp found.  I reviewed the pictures that I took at the time of surgery as well as the patient's final pathology.  She was given a copy of her final pathology for her records.  She is thrilled with the news that bleeding was caused by a benign polyp.  We discussed that there is a risk that she could develop a polyp again in the future and she understands the recommendation to call her GYN or my office if she develops postmenopausal bleeding again.  Patient is interested in transferring care to a new gynecologist and she was given some recommendations for additional providers.  26 minutes of total time was spent for this patient encounter, including preparation, face-to-face  counseling with the patient and coordination of care, and documentation of the encounter.  Jeral Pinch, MD  Division of Gynecologic Oncology  Department of Obstetrics and Gynecology  [facility]

## 2021-04-02 NOTE — Patient Instructions (Addendum)
You are healing well from surgery!  You can continue with your regular gynecologist for any future GYN needs.  Please make sure to seek medical care if you have future vaginal bleeding, either with me or with your gynecologist.  Recommendations for GYNs include:   Dr. Leda Quail at [facility] [place] [phone]  Dr. Conley Simmonds with [facility] [place] at [phone]

## 2021-04-06 ENCOUNTER — Other Ambulatory Visit: Payer: Self-pay | Admitting: Family Medicine

## 2021-04-06 DIAGNOSIS — Z1231 Encounter for screening mammogram for malignant neoplasm of breast: Secondary | ICD-10-CM

## 2021-04-08 ENCOUNTER — Encounter: Payer: No Typology Code available for payment source | Admitting: Gynecologic Oncology

## 2021-04-10 ENCOUNTER — Encounter: Payer: No Typology Code available for payment source | Admitting: Gynecologic Oncology

## 2021-04-17 ENCOUNTER — Other Ambulatory Visit: Payer: Self-pay | Admitting: Physician Assistant

## 2021-04-17 DIAGNOSIS — Q438 Other specified congenital malformations of intestine: Secondary | ICD-10-CM

## 2021-05-22 ENCOUNTER — Inpatient Hospital Stay: Admission: RE | Admit: 2021-05-22 | Payer: No Typology Code available for payment source | Source: Ambulatory Visit

## 2021-05-23 ENCOUNTER — Other Ambulatory Visit: Payer: Self-pay

## 2021-05-23 ENCOUNTER — Ambulatory Visit
Admission: RE | Admit: 2021-05-23 | Discharge: 2021-05-23 | Disposition: A | Discharge status: Home / Self Care | Payer: No Typology Code available for payment source | Source: Ambulatory Visit | Attending: Family Medicine | Admitting: Family Medicine

## 2021-05-23 DIAGNOSIS — Z1231 Encounter for screening mammogram for malignant neoplasm of breast: Secondary | ICD-10-CM

## 2021-05-27 ENCOUNTER — Other Ambulatory Visit: Payer: Self-pay | Admitting: Family Medicine

## 2021-05-27 DIAGNOSIS — R928 Other abnormal and inconclusive findings on diagnostic imaging of breast: Secondary | ICD-10-CM

## 2021-05-29 ENCOUNTER — Ambulatory Visit: Payer: No Typology Code available for payment source

## 2021-06-18 ENCOUNTER — Other Ambulatory Visit: Payer: No Typology Code available for payment source

## 2021-06-23 DIAGNOSIS — H524 Presbyopia: Secondary | ICD-10-CM | POA: Diagnosis not present

## 2021-06-26 ENCOUNTER — Ambulatory Visit
Admission: RE | Admit: 2021-06-26 | Discharge: 2021-06-26 | Disposition: A | Discharge status: Home / Self Care | Payer: No Typology Code available for payment source | Source: Ambulatory Visit | Attending: Family Medicine | Admitting: Family Medicine

## 2021-06-26 ENCOUNTER — Ambulatory Visit: Payer: No Typology Code available for payment source

## 2021-06-26 ENCOUNTER — Other Ambulatory Visit: Payer: Self-pay

## 2021-06-26 DIAGNOSIS — R928 Other abnormal and inconclusive findings on diagnostic imaging of breast: Secondary | ICD-10-CM

## 2021-07-30 ENCOUNTER — Other Ambulatory Visit: Payer: Self-pay | Admitting: Physician Assistant

## 2021-07-30 DIAGNOSIS — Z1211 Encounter for screening for malignant neoplasm of colon: Secondary | ICD-10-CM

## 2021-09-11 DIAGNOSIS — N3000 Acute cystitis without hematuria: Secondary | ICD-10-CM | POA: Diagnosis not present

## 2021-12-07 ENCOUNTER — Telehealth: Payer: Self-pay

## 2021-12-07 DIAGNOSIS — N736 Female pelvic peritoneal adhesions (postinfective): Secondary | ICD-10-CM | POA: Diagnosis not present

## 2021-12-07 DIAGNOSIS — N950 Postmenopausal bleeding: Secondary | ICD-10-CM | POA: Diagnosis not present

## 2021-12-07 DIAGNOSIS — R69 Illness, unspecified: Secondary | ICD-10-CM | POA: Diagnosis not present

## 2021-12-07 DIAGNOSIS — Z0100 Encounter for examination of eyes and vision without abnormal findings: Secondary | ICD-10-CM | POA: Diagnosis not present

## 2021-12-07 NOTE — Telephone Encounter (Signed)
Received call from Ms. Mary Juarez this afternoon. Patient following up after her appointment with Dr. Richardson Dopp. Patient states that Dr. Richardson Dopp is referring patient gyn oncology. Dr. Pricilla Holm notified. Instructed patient that our office will be on the lookout for Dr. Bing Ree note and we will be in touch with the patient to schedule an appointment. Patient verbalized understanding. Instructed to call with any needs.

## 2021-12-07 NOTE — Telephone Encounter (Signed)
Received call from Ms. Iacovelli this morning. Patient reports upon waking and going to the restroom she had a gush of dark red blood from her vagina. She denies clots, no pain, slight cramping. She is currently wearing a depends and endorses small amount of vaginal bleeding with dark red blood.  She reports feeling tired over the weekend but otherwise feels well.  She denies lightheadedness or dizziness. She denies any vaginal intercourse or heavy lifting prior to the bleeding starting. She states she contacted [facility] this morning and has an appointment at 1:45pm to be seen today. Patient reports not having the best experience at [facility] and wanted to call our office to see what we recommend. MD notified.  Dr. Pricilla Holm recommends patient keep her appointment with [facility] ob[facility] today and to call our office after her visit to follow up. Patient verbalized understanding and is in agreement of this. Instructed to call with any needs.

## 2021-12-09 ENCOUNTER — Telehealth: Payer: Self-pay

## 2021-12-09 NOTE — Telephone Encounter (Signed)
Left message with Marcelene [place] Dr. Dawayne Patricia office. Requesting office notes and any labs from Ms. Harrisons recent appointment.

## 2021-12-09 NOTE — Telephone Encounter (Signed)
Received call from [place] requesting to schedule appointment.Appointment scheduled for 12/25/21 at 2:30 pm. Patient is in agreement of appointment date and time. She is very appreciative of appointment.  Patient inquiring if our office has received referral from Dr. Bing Ree office. Advised that we have not received it yet but our office will call and follow up. Instructed to call with any needs.

## 2021-12-18 ENCOUNTER — Telehealth: Payer: Self-pay | Admitting: *Deleted

## 2021-12-18 NOTE — Telephone Encounter (Signed)
Spoke with the patient and explained that Dr Pricilla Holm will see her on 1/6. Explained that Dr Pricilla Holm will perform a biopsy on 1/6 and is scheduling an [place] scan.

## 2021-12-22 ENCOUNTER — Other Ambulatory Visit: Payer: Self-pay | Admitting: Gynecologic Oncology

## 2021-12-22 DIAGNOSIS — N950 Postmenopausal bleeding: Secondary | ICD-10-CM

## 2021-12-22 DIAGNOSIS — R9389 Abnormal findings on diagnostic imaging of other specified body structures: Secondary | ICD-10-CM

## 2021-12-22 NOTE — Progress Notes (Signed)
Patient continuing to have post-menopausal bleeding. [place] ordered per Dr. Pricilla Holm to evaluate the endometrium.

## 2021-12-23 ENCOUNTER — Other Ambulatory Visit: Payer: Self-pay | Admitting: Gynecologic Oncology

## 2021-12-23 NOTE — Telephone Encounter (Signed)
Per Dr Berline Lopes patient can have one time dose for anxiety and to combine the US/BX visits in the same day.  Appts moved to 1/20 and patient notified. Patient requested lorazepam 0.5mg , script send to patient's pharmacy

## 2021-12-23 NOTE — Telephone Encounter (Deleted)
Per Dr Pricilla Holm patient can have one time dose for anxiety and to combine the US/BX visits in the same day.  Appts moved to 1/20 and patient notified. Patient requested lorazepam 0.

## 2021-12-23 NOTE — Telephone Encounter (Addendum)
Spoke with the patient and gave the appt for the [place] on 1/12 at 3:30 pm. Patient stated that 1/12 was not good for her, patient given the number to central scheduling to reschedule her appt for fix her schedule.   Patient request a one time dose of anxiety med for both the BX and [place] appt. Explained that the message would be given to Dr Pricilla Holm and someone will call her back later today or tomorrow

## 2021-12-24 ENCOUNTER — Other Ambulatory Visit: Payer: Self-pay | Admitting: Gynecologic Oncology

## 2021-12-24 DIAGNOSIS — F419 Anxiety disorder, unspecified: Secondary | ICD-10-CM

## 2021-12-24 MED ORDER — LORAZEPAM 0.5 MG PO TABS: 0.5000 mg | ORAL_TABLET | Freq: Once | ORAL | 0 refills | Status: AC

## 2021-12-25 ENCOUNTER — Ambulatory Visit: Payer: No Typology Code available for payment source | Admitting: Gynecologic Oncology

## 2021-12-31 ENCOUNTER — Ambulatory Visit (HOSPITAL_COMMUNITY): Payer: No Typology Code available for payment source

## 2022-01-06 ENCOUNTER — Encounter: Payer: Self-pay | Admitting: Gynecologic Oncology

## 2022-01-07 ENCOUNTER — Ambulatory Visit (HOSPITAL_COMMUNITY): Payer: No Typology Code available for payment source

## 2022-01-08 ENCOUNTER — Ambulatory Visit (HOSPITAL_COMMUNITY)
Admission: RE | Admit: 2022-01-08 | Discharge: 2022-01-08 | Disposition: A | Discharge status: Home / Self Care | Payer: No Typology Code available for payment source | Source: Ambulatory Visit | Attending: Gynecologic Oncology | Admitting: Gynecologic Oncology

## 2022-01-08 ENCOUNTER — Inpatient Hospital Stay: Payer: No Typology Code available for payment source | Admitting: Gynecologic Oncology

## 2022-01-08 ENCOUNTER — Inpatient Hospital Stay: Payer: No Typology Code available for payment source | Attending: Gynecologic Oncology | Admitting: Gynecologic Oncology

## 2022-01-08 ENCOUNTER — Other Ambulatory Visit: Payer: Self-pay

## 2022-01-08 VITALS — BP 129/57 | HR 63 | Temp 97.7°F | Resp 16 | Ht 64.0 in | Wt 136.9 lb

## 2022-01-08 DIAGNOSIS — R9389 Abnormal findings on diagnostic imaging of other specified body structures: Secondary | ICD-10-CM

## 2022-01-08 DIAGNOSIS — F419 Anxiety disorder, unspecified: Secondary | ICD-10-CM | POA: Insufficient documentation

## 2022-01-08 DIAGNOSIS — N950 Postmenopausal bleeding: Secondary | ICD-10-CM | POA: Insufficient documentation

## 2022-01-08 DIAGNOSIS — F32A Depression, unspecified: Secondary | ICD-10-CM | POA: Insufficient documentation

## 2022-01-08 DIAGNOSIS — D682 Hereditary deficiency of other clotting factors: Secondary | ICD-10-CM | POA: Insufficient documentation

## 2022-01-08 DIAGNOSIS — R350 Frequency of micturition: Secondary | ICD-10-CM | POA: Insufficient documentation

## 2022-01-08 DIAGNOSIS — R6881 Early satiety: Secondary | ICD-10-CM | POA: Insufficient documentation

## 2022-01-08 DIAGNOSIS — Z780 Asymptomatic menopausal state: Secondary | ICD-10-CM | POA: Insufficient documentation

## 2022-01-08 DIAGNOSIS — R140 Abdominal distension (gaseous): Secondary | ICD-10-CM | POA: Insufficient documentation

## 2022-01-08 DIAGNOSIS — Z8742 Personal history of other diseases of the female genital tract: Secondary | ICD-10-CM | POA: Diagnosis not present

## 2022-01-08 DIAGNOSIS — R634 Abnormal weight loss: Secondary | ICD-10-CM | POA: Insufficient documentation

## 2022-01-08 NOTE — H&P (View-Only) (Signed)
Gynecologic Oncology Return Clinic Visit  01/08/2022  Reason for Visit: Recurrent vaginal bleeding  Treatment History: Patient endorses menopause at the age of 66-43.  She denies any postmenopausal bleeding until December 10 at which time she had sharp left-sided pelvic pain.  She then had some older appearing spotting.  She called and saw her primary care provider that day.  She was referred to gynecology and ultimately was able to schedule an appointment.  She had work-up including pelvic exam, Pap test, ultrasound, and the most recently attempt at endometrial biopsy.  Her pelvic ultrasound, performed at [facility] on 2/15, showed a uterus measuring 5 x 2.5 x 2.7 cm with an endometrial lining of 4.6 mm.  Ovaries were normal in appearance.  Myometrium noted to be normal.  Pap test showed negative for intraepithelial lesion, HPV negative.  Endometrial biopsy on 2/24 showed rare benign endometrial fragments in the specimen consisting mainly of mucus and endocervical glands.  Given her discomfort and intolerance of exams, the patient required premedication with lorazepam and Tylenol prior to both pelvic exams and her procedure.  Her endometrial biopsy had to be aborted early due to patient discomfort.  Only a small sample was able to be obtained.   Prior to the biopsy, she notes having what she describes as fluctuations in her hormone levels the past several months.  This was especially bothersome in January and she notes moodiness during this time.  With regard to her bleeding, she would have a spot of older appearing blood on the tissue paper when she wiped.  She also noted some bright red rectal bleeding that has been attributed to hemorrhoids.  She saw a gastroenterologist for this recently.  Since her attempted endometrial biopsy, she had a week of bright red bleeding, increased in quantity from what she was previously having.  Since Monday of this week, she denies any vaginal bleeding.   Overall,  patient notes that her appetite has been decreased for a number of weeks.  She is having to make herself eat.  She notes having early satiety and occasional bloating.  She reports about a 40 pound weight loss in the last 8-12 months.  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  She denies any attempt at weight loss.  She notes regular bowel movements which is aided by her high consumption of vegetables.  She has become more regular in the last several months.  She denies any changes to urination, with increased frequency especially at night which she has had for decades.   She describes a significant GYN history with very painful and heavy periods from a young age.  She saw multiple providers for this and ultimately no cause was found.  At points previously, she would run to help deal with and treat her pain.  She had laparoscopic surgery a number of years ago that she was thought perhaps to have endometriosis.  She was told after the surgery that she had extensive intra-abdominal scarring but the cause of this was unknown.  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  She had a previous attempted a D&C but this was abandoned due to inability to dilate the cervix and enter the uterine cavity.  She was placed on Megace subsequently for several months.  She also had a hysterosalpingogram at some point to check the patency of her fallopian tubes.  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   Patient underwent hysteroscopy with MyoSure sampling on 3/29 in the setting of postmenopausal bleeding.  Pathology revealed benign endometrial polyp, atrophic endometrium, no hyperplasia or malignancy  Interval History: Patient presents today after having 3-day episode of heavy vaginal bleeding.  She woke up on the morning of 12/19 with heavy bright red vaginal  bleeding.  She describes that this was like a menses.  She wore depends for this reason days and nights that she had bleeding.  The bleeding then stopped and she has had almost no spotting since then.  She did have a little bit of spotting after her transvaginal ultrasound today.  She denies any pelvic or abdominal pain.  Has somewhat decreased appetite secondary to stressors, has lost approximately 9 pounds since I saw her last in April.  Endorses bowel function at baseline although has periods of stool that she describes as "sludge".  She is post to get a virtual colonoscopy in the near future.  She has urinary frequency, unchanged from baseline.  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   Complication of anesthesia    per pt was very hypotensive w/ d&c hysteroscopy in 1990s in [place],  no issue the next surgery early 2000s don '@[facility]    Depression    Frequency of urination    History of chest pain    evaulation by cardiologist,  dr Debara Pickett,  ekg normal, ETT 06-25-2015 normal (in epic),  atypical chest pain no further work-up   History of concussion    per pt has had few times , last time at age 66,  no residual   PMB (postmenopausal bleeding)    Thickened endometrium    Wears glasses     Past Surgical History:  Procedure Laterality Date   CERVICAL POLYPECTOMY  early 2000s   dr Jarrett Soho '@[facility]    COLONOSCOPY  2012 approx.   DIAGNOSTIC LAPAROSCOPY  1970s   for painful menses   DILATATION & CURETTAGE/HYSTEROSCOPY WITH MYOSURE N/A 03/17/2021   Procedure: DILATATION & CURETTAGE/HYSTEROSCOPY WITH MYOSURE;  Surgeon: Lafonda Mosses, MD;  Location: [facility];  Service: Gynecology;  Laterality: N/A;   DILATION AND CURETTAGE OF UTERUS  1970s   HYSTEROSCOPY WITH D & C  1990s   NASAL FRACTURE SURGERY  age 66   OPERATIVE ULTRASOUND N/A 03/17/2021   Procedure: OPERATIVE ULTRASOUND;   Surgeon: Lafonda Mosses, MD;  Location: [facility];  Service: Gynecology;  Laterality: N/A;    Family History  Problem Relation Age of Onset   Stroke Mother    Heart disease Mother        pacemaker   Heart attack Father    Kidney failure Father    Heart disease Maternal Grandmother        pacemaker   Heart attack Maternal Grandfather    Sudden death Maternal Grandfather    Cancer Paternal Grandmother        oral - tobacco   Heart attack Paternal Grandfather    Sudden death Paternal Grandfather    Heart disease Brother        secondary cardiomyopathy, PVCs, atherosclerosis of native artery, syncope & collapse - sees cardiologist    Social History   Socioeconomic History   Marital status: Married  Spouse name: Not on file   Number of children: Not on file   Years of education: Not on file   Highest education level: Not on file  Occupational History   Occupation: Scientist, water quality at grocery store  Tobacco Use   Smoking status: Never   Smokeless tobacco: Never  Vaping Use   Vaping Use: Never used  Substance and Sexual Activity   Alcohol use: No   Drug use: Never   Sexual activity: Not Currently    Birth control/protection: Post-menopausal  Other Topics Concern   Not on file  Social History Narrative   Not on file   Social Determinants of Health   Financial Resource Strain: Not on file  Food Insecurity: Not on file  Transportation Needs: Not on file  Physical Activity: Not on file  Stress: Not on file  Social Connections: Not on file    Current Medications:  Current Outpatient Medications:    acetaminophen (TYLENOL) 500 MG tablet, Take 500-1,000 mg by mouth every 6 (six) hours as needed for mild pain, moderate pain, fever or headache., Disp: , Rfl:    calcium-vitamin D (OSCAL WITH D) 500-200 MG-UNIT per tablet, Take 2 tablets by mouth at bedtime., Disp: , Rfl:    clobetasol ointment (TEMOVATE) 0.05 %, APPLY TO AFFECTED AREA TWICE A DAY AS NEEDED,  Disp: , Rfl:    clotrimazole-betamethasone (LOTRISONE) cream, Apply topically 2 (two) times daily as needed., Disp: , Rfl:    diclofenac Sodium (VOLTAREN) 1 % GEL, 4 (four) times daily as needed., Disp: , Rfl:    hydrocortisone 2.5 % cream, Apply 1 application topically daily., Disp: , Rfl:    LORazepam (ATIVAN) 0.5 MG tablet, Take 1 tablet (0.5 mg total) by mouth once for 1 dose. Take 30 minutes prior to ultrasound, do not take and drive, Disp: 1 tablet, Rfl: 0   Multiple Vitamins-Minerals (MULTIVITAMIN ADULT, MINERALS, PO), Take 1 tablet by mouth daily., Disp: , Rfl:    venlafaxine XR (EFFEXOR-XR) 37.5 MG 24 hr capsule, Take 37.5 mg by mouth at bedtime., Disp: , Rfl:   Review of Systems: Pertinent positives include fatigue, rash, vaginal bleeding. Denies appetite changes, fevers, chills, unexplained weight changes. Denies hearing loss, neck lumps or masses, mouth sores, ringing in ears or voice changes. Denies cough or wheezing.  Denies shortness of breath. Denies chest pain or palpitations. Denies leg swelling. Denies abdominal distention, pain, blood in stools, constipation, diarrhea, nausea, vomiting, or early satiety. Denies pain with intercourse, dysuria, frequency, hematuria or incontinence. Denies hot flashes, pelvic pain,  or vaginal discharge.   Denies joint pain, back pain or muscle pain/cramps. Denies itching or wounds. Denies dizziness, headaches, numbness or seizures. Denies swollen lymph nodes or glands, denies easy bruising or bleeding. Denies anxiety, depression, confusion, or decreased concentration.  Physical Exam: BP (!) 129/57 (BP Location: Left Arm, Patient Position: Sitting)    Pulse 63    Temp 97.7 F (36.5 C) (Tympanic)    Resp 16    Ht '5\' 4"'  (1.626 m)    Wt 136 lb 14.4 oz (62.1 kg)    SpO2 100%    BMI 23.50 kg/m  General: Alert, oriented, no acute distress. HEENT: Normocephalic, atraumatic, sclera anicteric. Chest: Clear to auscultation bilaterally.  No wheezes  or rhonchi. Cardiovascular: Regular rate and rhythm, no murmurs. Abdomen: soft, nontender.  Normoactive bowel sounds.  No masses or hepatosplenomegaly appreciated.   Extremities: Grossly normal range of motion.  Warm, well perfused.  No edema bilaterally. Lymphatics: No cervical,  supraclavicular, or inguinal adenopathy. GU: Normal appearing external genitalia without erythema, excoriation, or lesions.  Speculum exam reveals narrow vagina, mildly atrophic.  When I appreciate to be the cervix is flush with the vaginal apex and deviated to the right.  I am able to place a tenaculum on the posterior aspect of the cervix and advance an endometrial Pipelle to approximately 3 cm before resistance is met.  Given appearance of what I believed to be the cervix, I did not feel comfortable placing more force to try to dilate the cervix..  Bimanual exam reveals no structure is palpated as the cervix.  There is about a 1-2 cm area of dilation in the upper part of the right vagina where I visually suspected the cervix to be.  On bimanual and rectovaginal exam, it is very difficult to definitively appreciate any cervix or uterus.  Endometrial biopsy - abandoned Preoperative diagnosis: Recurrent postmenopausal bleeding Postoperative diagnosis: Same as above Physician: Berline Lopes MD Estimated blood loss: Minimal Specimens: None Procedure: After discussing the risks and benefits of the procedure, the patient was placed in dorsolithotomy position.  A speculum was placed in the vagina with findings as noted above.  What I believed to be the cervix was cleansed with Betadine x3.  Tenaculum was placed on the posterior aspect of the cervix.  An endometrial Pipelle was advanced to about 3-1/2 cm before significant resistance was met.  Given my overall exam findings, no additional attempts were made.  Laboratory & Radiologic Studies: Pelvic ultrasound today: FINDINGS: Uterus   Measurements: 3.8 x 2.3 x 3.6 cm = volume: 16 mL.  Retroflexed. Atrophic. No gross mass.   Endometrium   Thickness: Suboptimally visualized due to bowel. Approximately 9 mm thick. No endometrial fluid   Right ovary   Not visualized, likely obscured by bowel   Left ovary   Not visualized, likely obscured by bowel   Other findings   No free pelvic fluid.  No adnexal masses.   IMPRESSION: Nonvisualization of ovaries.   Thickened endometrial complex approximately 9 mm thick. In the setting of post-menopausal bleeding, endometrial sampling is indicated to exclude carcinoma. If results are benign, sonohysterogram should be considered for focal lesion work-up. (Ref: Radiological Reasoning: Algorithmic Workup of Abnormal Vaginal Bleeding with Endovaginal Sonography and Sonohysterography. AJR 2008; 263:F35-45).  Assessment & Plan: Alantra Popoca is a 66 y.o. woman with recurrent postmenopausal bleeding in and thickened endometrium on ultrasound.  Ultrasound was discussed with the patient.  Images are somewhat difficult to interpret, but per radiologist, endometrial lining appears thickened.  Her uterus is even smaller than it has previously been measured, now just over 3 cm.  Is almost difficult to palpate it at all on exam.  Her exam is somewhat more challenging than what I remember from surgery, although this is the first time that I am examining her in clinic.  To make sure that we are safely and adequately sampling her endometrium, I recommended that we proceed with a similar procedure to what we did last time with an exam under anesthesia and outpatient surgery including hysteroscopy, dilation and curettage, and any other procedures, with ultrasound guidance if needed.  We discussed possible outcomes of pathology.  If benign, it may be reasonable to consider some oral progesterone to see if we can prevent redevelopment of a polyp or other benign growth.  If precancer or malignancy, then we discussed that definitive surgery would  likely be the next step.  The patient has some concerns about definitive surgery  given what has been reported to her as significant intra-abdominal adhesions.  I think in the setting of this surgical history, it is safest to proceed with a diagnostic procedure first and to consider hysterectomy only if indicated by pathology.  36 minutes of total time was spent for this patient encounter, including preparation, face-to-face counseling with the patient and coordination of care, and documentation of the encounter.  Jeral Pinch, MD  Division of Gynecologic Oncology  Department of Obstetrics and Gynecology  [facility]

## 2022-01-08 NOTE — Patient Instructions (Addendum)
Preparing for your Surgery  Plan for surgery on January 19, 2022 with Dr. Eugene Garnet at [facility]. You will be scheduled for examination under anesthesia, dilation and curettage of the uterus with hysteroscopy, and any other indicated procedures.   Pre-operative Testing -You will receive a phone call from presurgical testing at [facility] to discuss surgery instructions and arrange for lab work if needed.  -Bring your insurance card, copy of an advanced directive if applicable, medication list.  -You should not be taking blood thinners or aspirin at least ten days prior to surgery unless instructed by your surgeon.  -Do not take supplements such as fish oil (omega 3), red yeast rice, turmeric before your surgery. You want to avoid medications with aspirin in them including headache powders such as BC or Goody's), Excedrin migraine.  Day Before Surgery at Home -You will be advised you can have clear liquids up until 3 hours before your surgery.    Your role in recovery Your role is to become active as soon as directed by your doctor, while still giving yourself time to heal.  Rest when you feel tired. You will be asked to do the following in order to speed your recovery:  - Cough and breathe deeply. This helps to clear and expand your lungs and can prevent pneumonia after surgery.  - STAY ACTIVE WHEN YOU GET HOME. Do mild physical activity. Walking or moving your legs help your circulation and body functions return to normal. Do not try to get up or walk alone the first time after surgery.   -If you develop swelling on one leg or the other, pain in the back of your leg, redness/warmth in one of your legs, please call the office or go to the Emergency Room to have a doppler to rule out a blood clot. For shortness of breath, chest pain-seek care in the Emergency Room as soon as possible. - Actively manage your pain. Managing your pain lets you move  in comfort. We will ask you to rate your pain on a scale of zero to 10. It is your responsibility to tell your doctor or nurse where and how much you hurt so your pain can be treated.  Special Considerations -Your final pathology results from surgery should be available around one week after surgery and the results will be relayed to you when available.  -FMLA forms can be faxed to [phone] and please allow 5-7 business days for completion.  Pain Management After Surgery -Make sure that you have Tylenol and Ibuprofen at home to use on a regular basis after surgery for pain control. We recommend alternating the medications every hour to six hours since they work differently and are processed in the body differently for pain relief.  Bowel Regimen -It is important to prevent constipation and drink adequate amounts of liquids.   Risks of Surgery Risks of surgery are low but include bleeding, infection, damage to surrounding structures, re-operation, blood clots, and very rarely death.  AFTER SURGERY INSTRUCTIONS  Return to work:  1-2 days if applicable  Activity: 1. Be up and out of the bed during the day.  Take a nap if needed.  You may walk up steps but be careful and use the hand rail.  Stair climbing will tire you more than you think, you may need to stop part way and rest.   2. No lifting or straining for over 10 pounds, pushing, pulling, straining until Jan 25, 2022.  3. No driving for minimum 24 hours after surgery. Do not drive if you are taking narcotic pain medicine and make sure that your reaction time has returned.   4. You can shower as soon as the next day after surgery. Shower daily. No tub baths or submerging your body in water until cleared by your surgeon.   5. No sexual activity and nothing in the vagina for 4 weeks.  6. You may experience vaginal spotting and discharge after surgery.  The spotting is normal but if you experience heavy bleeding, call our  office.  7. Take Tylenol or ibuprofen for pain if you are able to take these medications. Monitor your Tylenol intake to a max of 4,000 mg in a 24 hour period. You can alternate these medications after surgery.  Diet: 1. Low sodium Heart Healthy Diet is recommended but you are cleared to resume your normal (before surgery) diet after your procedure.  2. It is safe to use a laxative, such as Miralax or Colace, if you have difficulty moving your bowels.   Wound Care: 1. Keep clean and dry.  Shower daily.  Reasons to call the Doctor: Fever - Oral temperature greater than 100.4 degrees Fahrenheit Foul-smelling vaginal discharge Difficulty urinating Nausea and vomiting Increased pain at the site of the incision that is unrelieved with pain medicine. Difficulty breathing with or without chest pain New calf pain especially if only on one side Sudden, continuing increased vaginal bleeding with or without clots.   Contacts: For questions or concerns you should contact:  Dr. Eugene Garnet at [phone]  Warner Mccreedy, NP at [phone]  After Hours: call [phone] and have the GYN Oncologist paged/contacted (after 5 pm or on the weekends).  Messages sent via mychart are for non-urgent matters and are not responded to after hours so for urgent needs, please call the after hours number.

## 2022-01-08 NOTE — Progress Notes (Signed)
Gynecologic Oncology Return Clinic Visit  01/08/2022  Reason for Visit: Recurrent vaginal bleeding  Treatment History: Patient endorses menopause at the age of 66-43.  She denies any postmenopausal bleeding until December 10 at which time she had sharp left-sided pelvic pain.  She then had some older appearing spotting.  She called and saw her primary care provider that day.  She was referred to gynecology and ultimately was able to schedule an appointment.  She had work-up including pelvic exam, Pap test, ultrasound, and the most recently attempt at endometrial biopsy.  Her pelvic ultrasound, performed at [facility] on 2/15, showed a uterus measuring 5 x 2.5 x 2.7 cm with an endometrial lining of 4.6 mm.  Ovaries were normal in appearance.  Myometrium noted to be normal.  Pap test showed negative for intraepithelial lesion, HPV negative.  Endometrial biopsy on 2/24 showed rare benign endometrial fragments in the specimen consisting mainly of mucus and endocervical glands.  Given her discomfort and intolerance of exams, the patient required premedication with lorazepam and Tylenol prior to both pelvic exams and her procedure.  Her endometrial biopsy had to be aborted early due to patient discomfort.  Only a small sample was able to be obtained.   Prior to the biopsy, she notes having what she describes as fluctuations in her hormone levels the past several months.  This was especially bothersome in January and she notes moodiness during this time.  With regard to her bleeding, she would have a spot of older appearing blood on the tissue paper when she wiped.  She also noted some bright red rectal bleeding that has been attributed to hemorrhoids.  She saw a gastroenterologist for this recently.  Since her attempted endometrial biopsy, she had a week of bright red bleeding, increased in quantity from what she was previously having.  Since Monday of this week, she denies any vaginal bleeding.   Overall,  patient notes that her appetite has been decreased for a number of weeks.  She is having to make herself eat.  She notes having early satiety and occasional bloating.  She reports about a 40 pound weight loss in the last 8-12 months.  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  She denies any attempt at weight loss.  She notes regular bowel movements which is aided by her high consumption of vegetables.  She has become more regular in the last several months.  She denies any changes to urination, with increased frequency especially at night which she has had for decades.   She describes a significant GYN history with very painful and heavy periods from a young age.  She saw multiple providers for this and ultimately no cause was found.  At points previously, she would run to help deal with and treat her pain.  She had laparoscopic surgery a number of years ago that she was thought perhaps to have endometriosis.  She was told after the surgery that she had extensive intra-abdominal scarring but the cause of this was unknown.  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  She had a previous attempted a D&C but this was abandoned due to inability to dilate the cervix and enter the uterine cavity.  She was placed on Megace subsequently for several months.  She also had a hysterosalpingogram at some point to check the patency of her fallopian tubes.  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   Patient underwent hysteroscopy with MyoSure sampling on 3/29 in the setting of postmenopausal bleeding.  Pathology revealed benign endometrial polyp, atrophic endometrium, no hyperplasia or malignancy  Interval History: Patient presents today after having 3-day episode of heavy vaginal bleeding.  She woke up on the morning of 12/19 with heavy bright red vaginal  bleeding.  She describes that this was like a menses.  She wore depends for this reason days and nights that she had bleeding.  The bleeding then stopped and she has had almost no spotting since then.  She did have a little bit of spotting after her transvaginal ultrasound today.  She denies any pelvic or abdominal pain.  Has somewhat decreased appetite secondary to stressors, has lost approximately 9 pounds since I saw her last in April.  Endorses bowel function at baseline although has periods of stool that she describes as "sludge".  She is post to get a virtual colonoscopy in the near future.  She has urinary frequency, unchanged from baseline.  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   Complication of anesthesia    per pt was very hypotensive w/ d&c hysteroscopy in 1990s in [place],  no issue the next surgery early 2000s don '@[facility]    Depression    Frequency of urination    History of chest pain    evaulation by cardiologist,  dr Debara Pickett,  ekg normal, ETT 06-25-2015 normal (in epic),  atypical chest pain no further work-up   History of concussion    per pt has had few times , last time at age 49,  no residual   PMB (postmenopausal bleeding)    Thickened endometrium    Wears glasses     Past Surgical History:  Procedure Laterality Date   CERVICAL POLYPECTOMY  early 2000s   dr Jarrett Soho '@[facility]    COLONOSCOPY  2012 approx.   DIAGNOSTIC LAPAROSCOPY  1970s   for painful menses   DILATATION & CURETTAGE/HYSTEROSCOPY WITH MYOSURE N/A 03/17/2021   Procedure: DILATATION & CURETTAGE/HYSTEROSCOPY WITH MYOSURE;  Surgeon: Lafonda Mosses, MD;  Location: [facility];  Service: Gynecology;  Laterality: N/A;   DILATION AND CURETTAGE OF UTERUS  1970s   HYSTEROSCOPY WITH D & C  1990s   NASAL FRACTURE SURGERY  age 16   OPERATIVE ULTRASOUND N/A 03/17/2021   Procedure: OPERATIVE ULTRASOUND;   Surgeon: Lafonda Mosses, MD;  Location: [facility];  Service: Gynecology;  Laterality: N/A;    Family History  Problem Relation Age of Onset   Stroke Mother    Heart disease Mother        pacemaker   Heart attack Father    Kidney failure Father    Heart disease Maternal Grandmother        pacemaker   Heart attack Maternal Grandfather    Sudden death Maternal Grandfather    Cancer Paternal Grandmother        oral - tobacco   Heart attack Paternal Grandfather    Sudden death Paternal Grandfather    Heart disease Brother        secondary cardiomyopathy, PVCs, atherosclerosis of native artery, syncope & collapse - sees cardiologist    Social History   Socioeconomic History   Marital status: Married  Spouse name: Not on file   Number of children: Not on file   Years of education: Not on file   Highest education level: Not on file  Occupational History   Occupation: Scientist, water quality at grocery store  Tobacco Use   Smoking status: Never   Smokeless tobacco: Never  Vaping Use   Vaping Use: Never used  Substance and Sexual Activity   Alcohol use: No   Drug use: Never   Sexual activity: Not Currently    Birth control/protection: Post-menopausal  Other Topics Concern   Not on file  Social History Narrative   Not on file   Social Determinants of Health   Financial Resource Strain: Not on file  Food Insecurity: Not on file  Transportation Needs: Not on file  Physical Activity: Not on file  Stress: Not on file  Social Connections: Not on file    Current Medications:  Current Outpatient Medications:    acetaminophen (TYLENOL) 500 MG tablet, Take 500-1,000 mg by mouth every 6 (six) hours as needed for mild pain, moderate pain, fever or headache., Disp: , Rfl:    calcium-vitamin D (OSCAL WITH D) 500-200 MG-UNIT per tablet, Take 2 tablets by mouth at bedtime., Disp: , Rfl:    clobetasol ointment (TEMOVATE) 0.05 %, APPLY TO AFFECTED AREA TWICE A DAY AS NEEDED,  Disp: , Rfl:    clotrimazole-betamethasone (LOTRISONE) cream, Apply topically 2 (two) times daily as needed., Disp: , Rfl:    diclofenac Sodium (VOLTAREN) 1 % GEL, 4 (four) times daily as needed., Disp: , Rfl:    hydrocortisone 2.5 % cream, Apply 1 application topically daily., Disp: , Rfl:    LORazepam (ATIVAN) 0.5 MG tablet, Take 1 tablet (0.5 mg total) by mouth once for 1 dose. Take 30 minutes prior to ultrasound, do not take and drive, Disp: 1 tablet, Rfl: 0   Multiple Vitamins-Minerals (MULTIVITAMIN ADULT, MINERALS, PO), Take 1 tablet by mouth daily., Disp: , Rfl:    venlafaxine XR (EFFEXOR-XR) 37.5 MG 24 hr capsule, Take 37.5 mg by mouth at bedtime., Disp: , Rfl:   Review of Systems: Pertinent positives include fatigue, rash, vaginal bleeding. Denies appetite changes, fevers, chills, unexplained weight changes. Denies hearing loss, neck lumps or masses, mouth sores, ringing in ears or voice changes. Denies cough or wheezing.  Denies shortness of breath. Denies chest pain or palpitations. Denies leg swelling. Denies abdominal distention, pain, blood in stools, constipation, diarrhea, nausea, vomiting, or early satiety. Denies pain with intercourse, dysuria, frequency, hematuria or incontinence. Denies hot flashes, pelvic pain,  or vaginal discharge.   Denies joint pain, back pain or muscle pain/cramps. Denies itching or wounds. Denies dizziness, headaches, numbness or seizures. Denies swollen lymph nodes or glands, denies easy bruising or bleeding. Denies anxiety, depression, confusion, or decreased concentration.  Physical Exam: BP (!) 129/57 (BP Location: Left Arm, Patient Position: Sitting)    Pulse 63    Temp 97.7 F (36.5 C) (Tympanic)    Resp 16    Ht '5\' 4"'  (1.626 m)    Wt 136 lb 14.4 oz (62.1 kg)    SpO2 100%    BMI 23.50 kg/m  General: Alert, oriented, no acute distress. HEENT: Normocephalic, atraumatic, sclera anicteric. Chest: Clear to auscultation bilaterally.  No wheezes  or rhonchi. Cardiovascular: Regular rate and rhythm, no murmurs. Abdomen: soft, nontender.  Normoactive bowel sounds.  No masses or hepatosplenomegaly appreciated.   Extremities: Grossly normal range of motion.  Warm, well perfused.  No edema bilaterally. Lymphatics: No cervical,  supraclavicular, or inguinal adenopathy. GU: Normal appearing external genitalia without erythema, excoriation, or lesions.  Speculum exam reveals narrow vagina, mildly atrophic.  When I appreciate to be the cervix is flush with the vaginal apex and deviated to the right.  I am able to place a tenaculum on the posterior aspect of the cervix and advance an endometrial Pipelle to approximately 3 cm before resistance is met.  Given appearance of what I believed to be the cervix, I did not feel comfortable placing more force to try to dilate the cervix..  Bimanual exam reveals no structure is palpated as the cervix.  There is about a 1-2 cm area of dilation in the upper part of the right vagina where I visually suspected the cervix to be.  On bimanual and rectovaginal exam, it is very difficult to definitively appreciate any cervix or uterus.  Endometrial biopsy - abandoned Preoperative diagnosis: Recurrent postmenopausal bleeding Postoperative diagnosis: Same as above Physician: Berline Lopes MD Estimated blood loss: Minimal Specimens: None Procedure: After discussing the risks and benefits of the procedure, the patient was placed in dorsolithotomy position.  A speculum was placed in the vagina with findings as noted above.  What I believed to be the cervix was cleansed with Betadine x3.  Tenaculum was placed on the posterior aspect of the cervix.  An endometrial Pipelle was advanced to about 3-1/2 cm before significant resistance was met.  Given my overall exam findings, no additional attempts were made.  Laboratory & Radiologic Studies: Pelvic ultrasound today: FINDINGS: Uterus   Measurements: 3.8 x 2.3 x 3.6 cm = volume: 16 mL.  Retroflexed. Atrophic. No gross mass.   Endometrium   Thickness: Suboptimally visualized due to bowel. Approximately 9 mm thick. No endometrial fluid   Right ovary   Not visualized, likely obscured by bowel   Left ovary   Not visualized, likely obscured by bowel   Other findings   No free pelvic fluid.  No adnexal masses.   IMPRESSION: Nonvisualization of ovaries.   Thickened endometrial complex approximately 9 mm thick. In the setting of post-menopausal bleeding, endometrial sampling is indicated to exclude carcinoma. If results are benign, sonohysterogram should be considered for focal lesion work-up. (Ref: Radiological Reasoning: Algorithmic Workup of Abnormal Vaginal Bleeding with Endovaginal Sonography and Sonohysterography. AJR 2008; 174:B44-96).  Assessment & Plan: Daisie Haft is a 66 y.o. woman with recurrent postmenopausal bleeding in and thickened endometrium on ultrasound.  Ultrasound was discussed with the patient.  Images are somewhat difficult to interpret, but per radiologist, endometrial lining appears thickened.  Her uterus is even smaller than it has previously been measured, now just over 3 cm.  Is almost difficult to palpate it at all on exam.  Her exam is somewhat more challenging than what I remember from surgery, although this is the first time that I am examining her in clinic.  To make sure that we are safely and adequately sampling her endometrium, I recommended that we proceed with a similar procedure to what we did last time with an exam under anesthesia and outpatient surgery including hysteroscopy, dilation and curettage, and any other procedures, with ultrasound guidance if needed.  We discussed possible outcomes of pathology.  If benign, it may be reasonable to consider some oral progesterone to see if we can prevent redevelopment of a polyp or other benign growth.  If precancer or malignancy, then we discussed that definitive surgery would  likely be the next step.  The patient has some concerns about definitive surgery  given what has been reported to her as significant intra-abdominal adhesions.  I think in the setting of this surgical history, it is safest to proceed with a diagnostic procedure first and to consider hysterectomy only if indicated by pathology.  36 minutes of total time was spent for this patient encounter, including preparation, face-to-face counseling with the patient and coordination of care, and documentation of the encounter.  Jeral Pinch, MD  Division of Gynecologic Oncology  Department of Obstetrics and Gynecology  [facility]

## 2022-01-11 ENCOUNTER — Other Ambulatory Visit (HOSPITAL_COMMUNITY): Payer: Self-pay | Admitting: Gynecologic Oncology

## 2022-01-11 ENCOUNTER — Other Ambulatory Visit: Payer: Self-pay | Admitting: Gynecologic Oncology

## 2022-01-11 ENCOUNTER — Telehealth: Payer: Self-pay

## 2022-01-11 DIAGNOSIS — R9389 Abnormal findings on diagnostic imaging of other specified body structures: Secondary | ICD-10-CM

## 2022-01-11 DIAGNOSIS — N950 Postmenopausal bleeding: Secondary | ICD-10-CM

## 2022-01-11 NOTE — Telephone Encounter (Signed)
Received call from Ms. Notarianni this morning. Patient inquiring what time her surgery will be on 01/19/22 and when she will need to arrive. She also noticed that an ultrasound is scheduled for 01/19/22 and is wondering if this will be done in surgery or if it's before the surgery.  Advised patient that her surgery is scheduled to start at 10:45 am and she will need to arrive 2 hours before ( 8:45 am). Instructed her that someone from the surgical center will be calling her prior to the procedure and our office will call her the day before surgery. The ultrasound is to be performed during her procedure.  Patient is needing FMLA forms to be completed by our office. She is planning on dropping this off at the cancer center on 01/12/22. Instructed patient that forms will be completed within 5-7 business days. Patient verbalized understanding. Instructed to call with any needs.

## 2022-01-14 ENCOUNTER — Encounter (HOSPITAL_BASED_OUTPATIENT_CLINIC_OR_DEPARTMENT_OTHER): Payer: Self-pay | Admitting: Gynecologic Oncology

## 2022-01-14 ENCOUNTER — Telehealth: Payer: Self-pay

## 2022-01-14 ENCOUNTER — Other Ambulatory Visit: Payer: Self-pay

## 2022-01-14 NOTE — Progress Notes (Addendum)
Spoke w/ via phone for pre-op interview---patient Lab needs dos----NA               Lab results------EKG 02/26/21 COVID test -----patient states asymptomatic no test needed. Arrive at -------0900 01/19/22 NPO after MN NO Solid Food.  Clear liquids from MN until---0800 Med rec completed Medications to take morning of surgery -----NA; Hold MVI 5 days prior to sx Diabetic medication -----NA Patient instructed no nail polish to be worn day of surgery Patient instructed to bring photo id and insurance card day of surgery Patient aware to have Driver (ride ) / caregiver    for 24 hours after surgery  Patient Special Instructions -----NA Pre-Op special Istructions -----Pt does not have Von Willebrand per Dr. Leonides Schanz- labs from 02/2021 negative for this as well. Patient verbalized understanding of instructions that were given at this phone interview. Patient denies shortness of breath, chest pain, fever, cough at this phone interview.

## 2022-01-14 NOTE — Telephone Encounter (Signed)
Called patient to inform her that her FMLA paperwork is filled out and ready to be picked up.

## 2022-01-15 ENCOUNTER — Telehealth: Payer: Self-pay | Admitting: *Deleted

## 2022-01-15 ENCOUNTER — Telehealth: Payer: Self-pay

## 2022-01-15 NOTE — Telephone Encounter (Signed)
Spoke with patient regarding extra paperwork she dropped off this morning. Patient reports that "Kayren Eaves in the benefits department said that she does not need the extra paperwork filled out at this time, but to hold on to it just incase."  Patient also has requested a work note from 01/06/2022-01/18/2022 due to being out of work from a unsuccessful procedure in the office on Friday.   I spoke with Warner Mccreedy, NP and she stated that we can only write a work note for Friday 1/20 because she was in the clinic that day.  Patient requested the note be faxed to Kayren Eaves in the benefits department @ [phone]. Informed patient to call with any questions or concerns.

## 2022-01-15 NOTE — Telephone Encounter (Signed)
Received call from Ms. Mary Juarez this morning. Patient is appreciative of assistance with FMLA paperwork. However Mary Juarez has denied her request for FMLA. Patient is in need of non-FMLA paperwork to be completed. She has dropped this off at the office today. Advised her that paperwork will be completed in 7-10 business days, the office will call when it is completed so she can pick it up. Patient verbalized understanding.

## 2022-01-15 NOTE — Telephone Encounter (Signed)
Spoke with Mrs. Duffus and she stated " Mary Juarez from benefits is sending her more paperwork to fill out and once they are filled out everything will be good" She also stated that Jamesetta Orleans will be giving her short term disability paperwork that she will bring on Tuesday.

## 2022-01-15 NOTE — Telephone Encounter (Signed)
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 And you can call me back on my cell phone. You can leave a message if need be." Explained that Melissa APP was rounding in the hospital and that the message would be given to her once she comes back down. Explained once Melissa APP answered the question the office would call him back.

## 2022-01-18 ENCOUNTER — Telehealth: Payer: Self-pay

## 2022-01-18 NOTE — Telephone Encounter (Signed)
Telephone call to check on pre-operative status.  Patient compliant with pre-operative instructions.  Reinforced nothing to eat after midnight. Clear liquids until 0800. Patient to arrive at 0900.  No questions or concerns voiced.  Instructed to call for any needs. 

## 2022-01-18 NOTE — Telephone Encounter (Signed)
Attempted to reach patient to check in with her pre-operatively. Unable to reach patient. Left message requesting return call.  

## 2022-01-19 ENCOUNTER — Encounter (HOSPITAL_BASED_OUTPATIENT_CLINIC_OR_DEPARTMENT_OTHER)
Admission: RE | Disposition: A | Discharge status: Home / Self Care | Payer: Self-pay | Source: Home / Self Care | Attending: Gynecologic Oncology

## 2022-01-19 ENCOUNTER — Ambulatory Visit (HOSPITAL_COMMUNITY)
Admission: RE | Admit: 2022-01-19 | Discharge: 2022-01-19 | Disposition: A | Discharge status: Home / Self Care | Payer: No Typology Code available for payment source | Source: Ambulatory Visit | Attending: Gynecologic Oncology | Admitting: Gynecologic Oncology

## 2022-01-19 ENCOUNTER — Ambulatory Visit (HOSPITAL_BASED_OUTPATIENT_CLINIC_OR_DEPARTMENT_OTHER): Payer: No Typology Code available for payment source | Admitting: Anesthesiology

## 2022-01-19 ENCOUNTER — Encounter (HOSPITAL_BASED_OUTPATIENT_CLINIC_OR_DEPARTMENT_OTHER): Payer: Self-pay | Admitting: Gynecologic Oncology

## 2022-01-19 ENCOUNTER — Other Ambulatory Visit: Payer: Self-pay

## 2022-01-19 ENCOUNTER — Ambulatory Visit (HOSPITAL_BASED_OUTPATIENT_CLINIC_OR_DEPARTMENT_OTHER)
Admission: RE | Admit: 2022-01-19 | Discharge: 2022-01-19 | Disposition: A | Discharge status: Home / Self Care | Payer: No Typology Code available for payment source | Attending: Gynecologic Oncology | Admitting: Gynecologic Oncology

## 2022-01-19 DIAGNOSIS — N950 Postmenopausal bleeding: Secondary | ICD-10-CM | POA: Diagnosis not present

## 2022-01-19 DIAGNOSIS — R9389 Abnormal findings on diagnostic imaging of other specified body structures: Secondary | ICD-10-CM | POA: Diagnosis not present

## 2022-01-19 DIAGNOSIS — F32A Depression, unspecified: Secondary | ICD-10-CM | POA: Insufficient documentation

## 2022-01-19 DIAGNOSIS — D66 Hereditary factor VIII deficiency: Secondary | ICD-10-CM | POA: Diagnosis not present

## 2022-01-19 DIAGNOSIS — F419 Anxiety disorder, unspecified: Secondary | ICD-10-CM | POA: Insufficient documentation

## 2022-01-19 DIAGNOSIS — N858 Other specified noninflammatory disorders of uterus: Secondary | ICD-10-CM | POA: Insufficient documentation

## 2022-01-19 DIAGNOSIS — N895 Stricture and atresia of vagina: Secondary | ICD-10-CM | POA: Diagnosis not present

## 2022-01-19 DIAGNOSIS — R69 Illness, unspecified: Secondary | ICD-10-CM | POA: Diagnosis not present

## 2022-01-19 HISTORY — PX: DILATATION & CURETTAGE/HYSTEROSCOPY WITH MYOSURE: SHX6511

## 2022-01-19 HISTORY — PX: OPERATIVE ULTRASOUND: SHX5996

## 2022-01-19 SURGERY — EXAM UNDER ANESTHESIA
Anesthesia: General | Site: Vagina

## 2022-01-19 MED ORDER — ACETAMINOPHEN 500 MG PO TABS
ORAL_TABLET | ORAL | Status: AC
  Filled 2022-01-19: qty 2

## 2022-01-19 MED ORDER — ONDANSETRON HCL 4 MG/2ML IJ SOLN
INTRAMUSCULAR | Status: AC
  Filled 2022-01-19: qty 2

## 2022-01-19 MED ORDER — EPHEDRINE 5 MG/ML INJ
INTRAVENOUS | Status: AC
  Filled 2022-01-19: qty 5

## 2022-01-19 MED ORDER — OXYCODONE HCL 5 MG PO TABS: 5.0000 mg | ORAL_TABLET | Freq: Once | ORAL | Status: DC | PRN

## 2022-01-19 MED ORDER — ONDANSETRON HCL 4 MG/2ML IJ SOLN
INTRAMUSCULAR | Status: DC | PRN
  Administered 2022-01-19: 4 mg via INTRAVENOUS

## 2022-01-19 MED ORDER — MIDAZOLAM HCL 5 MG/5ML IJ SOLN
INTRAMUSCULAR | Status: DC | PRN
  Administered 2022-01-19: 2 mg via INTRAVENOUS

## 2022-01-19 MED ORDER — ONDANSETRON HCL 4 MG/2ML IJ SOLN: 4.0000 mg | Freq: Once | INTRAMUSCULAR | Status: DC | PRN

## 2022-01-19 MED ORDER — PHENYLEPHRINE 40 MCG/ML (10ML) SYRINGE FOR IV PUSH (FOR BLOOD PRESSURE SUPPORT)
PREFILLED_SYRINGE | INTRAVENOUS | Status: DC | PRN
Start: 2022-01-19 — End: 2022-01-19
  Administered 2022-01-19: 80 ug via INTRAVENOUS

## 2022-01-19 MED ORDER — KETOROLAC TROMETHAMINE 30 MG/ML IJ SOLN: 15.0000 mg | Freq: Once | INTRAMUSCULAR | Status: DC | PRN

## 2022-01-19 MED ORDER — DEXAMETHASONE SODIUM PHOSPHATE 10 MG/ML IJ SOLN
INTRAMUSCULAR | Status: AC
  Filled 2022-01-19: qty 1

## 2022-01-19 MED ORDER — MIDAZOLAM HCL 2 MG/2ML IJ SOLN
INTRAMUSCULAR | Status: AC
  Filled 2022-01-19: qty 2

## 2022-01-19 MED ORDER — SODIUM CHLORIDE 0.9 % IR SOLN
Status: DC | PRN
  Administered 2022-01-19: 1000 mL

## 2022-01-19 MED ORDER — FENTANYL CITRATE (PF) 100 MCG/2ML IJ SOLN
INTRAMUSCULAR | Status: DC | PRN
  Administered 2022-01-19: 25 ug via INTRAVENOUS
  Administered 2022-01-19: 50 ug via INTRAVENOUS
  Administered 2022-01-19: 25 ug via INTRAVENOUS

## 2022-01-19 MED ORDER — KETOROLAC TROMETHAMINE 30 MG/ML IJ SOLN
INTRAMUSCULAR | Status: DC | PRN
  Administered 2022-01-19: 30 mg via INTRAVENOUS

## 2022-01-19 MED ORDER — KETOROLAC TROMETHAMINE 30 MG/ML IJ SOLN
INTRAMUSCULAR | Status: AC
  Filled 2022-01-19: qty 1

## 2022-01-19 MED ORDER — FENTANYL CITRATE (PF) 100 MCG/2ML IJ SOLN: 25.0000 ug | INTRAMUSCULAR | Status: DC | PRN

## 2022-01-19 MED ORDER — LACTATED RINGERS IV SOLN: INTRAVENOUS | Status: DC

## 2022-01-19 MED ORDER — ACETAMINOPHEN 500 MG PO TABS
1000.0000 mg | ORAL_TABLET | ORAL | Status: AC
  Administered 2022-01-19: 1000 mg via ORAL

## 2022-01-19 MED ORDER — DEXAMETHASONE SODIUM PHOSPHATE 10 MG/ML IJ SOLN: 4.0000 mg | INTRAMUSCULAR | Status: DC

## 2022-01-19 MED ORDER — LIDOCAINE HCL (PF) 2 % IJ SOLN
INTRAMUSCULAR | Status: AC
  Filled 2022-01-19: qty 5

## 2022-01-19 MED ORDER — LIDOCAINE 2% (20 MG/ML) 5 ML SYRINGE
INTRAMUSCULAR | Status: DC | PRN
  Administered 2022-01-19: 60 mg via INTRAVENOUS

## 2022-01-19 MED ORDER — DEXAMETHASONE SODIUM PHOSPHATE 10 MG/ML IJ SOLN
INTRAMUSCULAR | Status: DC | PRN
  Administered 2022-01-19: 10 mg via INTRAVENOUS

## 2022-01-19 MED ORDER — BUPIVACAINE HCL 0.25 % IJ SOLN
INTRAMUSCULAR | Status: DC | PRN
  Administered 2022-01-19: 10 mL

## 2022-01-19 MED ORDER — OXYCODONE HCL 5 MG/5ML PO SOLN: 5.0000 mg | Freq: Once | ORAL | Status: DC | PRN

## 2022-01-19 MED ORDER — PROPOFOL 10 MG/ML IV BOLUS
INTRAVENOUS | Status: DC | PRN
  Administered 2022-01-19: 50 mg via INTRAVENOUS
  Administered 2022-01-19: 100 mg via INTRAVENOUS

## 2022-01-19 MED ORDER — FENTANYL CITRATE (PF) 100 MCG/2ML IJ SOLN
INTRAMUSCULAR | Status: AC
  Filled 2022-01-19: qty 2

## 2022-01-19 MED ORDER — EPHEDRINE SULFATE-NACL 50-0.9 MG/10ML-% IV SOSY
PREFILLED_SYRINGE | INTRAVENOUS | Status: DC | PRN
  Administered 2022-01-19: 5 mg via INTRAVENOUS
  Administered 2022-01-19 (×2): 10 mg via INTRAVENOUS

## 2022-01-19 MED ORDER — AMISULPRIDE (ANTIEMETIC) 5 MG/2ML IV SOLN: 10.0000 mg | Freq: Once | INTRAVENOUS | Status: DC | PRN

## 2022-01-19 MED ORDER — PHENYLEPHRINE 40 MCG/ML (10ML) SYRINGE FOR IV PUSH (FOR BLOOD PRESSURE SUPPORT)
PREFILLED_SYRINGE | INTRAVENOUS | Status: AC
  Filled 2022-01-19: qty 10

## 2022-01-19 SURGICAL SUPPLY — 30 items
BIPOLAR CUTTING LOOP 21FR (ELECTRODE)
BLADE SURG 11 STRL SS (BLADE) IMPLANT
BLADE SURG 15 STRL LF DISP TIS (BLADE) IMPLANT
BLADE SURG 15 STRL SS (BLADE)
CATH ROBINSON RED A/P 16FR (CATHETERS) IMPLANT
DEVICE MYOSURE LITE (MISCELLANEOUS) IMPLANT
DEVICE MYOSURE REACH (MISCELLANEOUS) ×1 IMPLANT
DILATOR CANAL MILEX (MISCELLANEOUS) IMPLANT
DRSG TELFA 3X8 NADH (GAUZE/BANDAGES/DRESSINGS) ×2 IMPLANT
GAUZE 4X4 16PLY ~~LOC~~+RFID DBL (SPONGE) ×4 IMPLANT
GLOVE SURG ENC MOIS LTX SZ6 (GLOVE) ×4 IMPLANT
GLOVE SURG ENC MOIS LTX SZ6.5 (GLOVE) IMPLANT
GOWN STRL REUS W/TWL LRG LVL3 (GOWN DISPOSABLE) ×2 IMPLANT
IV NS IRRIG 3000ML ARTHROMATIC (IV SOLUTION) ×2 IMPLANT
KIT TURNOVER CYSTO (KITS) ×2 IMPLANT
LOOP CUTTING BIPOLAR 21FR (ELECTRODE) IMPLANT
NDL HYPO 25X1 1.5 SAFETY (NEEDLE) ×1 IMPLANT
NEEDLE HYPO 25X1 1.5 SAFETY (NEEDLE) ×2 IMPLANT
NS IRRIG 500ML POUR BTL (IV SOLUTION) ×2 IMPLANT
PACK PERINEAL COLD (PAD) ×2 IMPLANT
PACK VAGINAL MINOR WOMEN LF (CUSTOM PROCEDURE TRAY) ×2 IMPLANT
PACK VAGINAL WOMENS (CUSTOM PROCEDURE TRAY) ×2 IMPLANT
PAD DRESSING TELFA 3X8 NADH (GAUZE/BANDAGES/DRESSINGS) ×1 IMPLANT
PAD OB MATERNITY 4.3X12.25 (PERSONAL CARE ITEMS) ×2 IMPLANT
PAD PREP 24X48 CUFFED NSTRL (MISCELLANEOUS) ×2 IMPLANT
PENCIL BUTTON HOLSTER BLD 10FT (ELECTRODE) ×1 IMPLANT
SEAL ROD LENS SCOPE MYOSURE (ABLATOR) ×2 IMPLANT
SYR BULB IRRIG 60ML STRL (SYRINGE) ×2 IMPLANT
TOWEL OR 17X26 10 PK STRL BLUE (TOWEL DISPOSABLE) ×4 IMPLANT
WATER STERILE IRR 500ML POUR (IV SOLUTION) ×2 IMPLANT

## 2022-01-19 NOTE — Anesthesia Postprocedure Evaluation (Signed)
Anesthesia Post Note  Patient: Mary Juarez  Procedure(s) Performed: EXAM UNDER ANESTHESIA (Vagina ) DILATATION & CURETTAGE/HYSTEROSCOPY WITH MYOSURE (Vagina ) POSSIBLE OPERATIVE ULTRASOUND GUIDANCE (Vagina )     Patient location during evaluation: PACU Anesthesia Type: General Level of consciousness: awake and alert, oriented and patient cooperative Pain management: pain level controlled Vital Signs Assessment: post-procedure vital signs reviewed and stable Respiratory status: spontaneous breathing, nonlabored ventilation and respiratory function stable Cardiovascular status: blood pressure returned to baseline and stable Postop Assessment: no apparent nausea or vomiting Anesthetic complications: no   No notable events documented.  Last Vitals:  Vitals:   01/19/22 1114 01/19/22 1115  BP: 133/66 133/66  Pulse: 73 68  Resp: 17 16  Temp:    SpO2: 100% 100%    Last Pain:  Vitals:   01/19/22 1110  TempSrc:   PainSc: 0-No pain                 Lannie Fields

## 2022-01-19 NOTE — Discharge Instructions (Addendum)
01/19/2022  Return to work: 1-2 days  Activity: 1. Be up and out of the bed during the day.  Take a nap if needed.    2. No lifting or straining for 2 weeks.  3. Do Not drive if you are taking narcotic pain medicine.  4. Shower daily.  Use soap and water on your incision and pat dry; don't rub.   5. No sexual activity and nothing in the vagina for 2 weeks. No baths for 2 weeks.  Medications:  - Take ibuprofen and tylenol first line for pain control. Take these regularly (every 6 hours) to decrease the build up of pain.  - If taking any narcotics you should take sennakot every night to reduce the likelihood of constipation. If this causes diarrhea, stop its use.  Diet: 1. Low sodium Heart Healthy Diet is recommended.  2. It is safe to use a laxative if you have difficulty moving your bowels.   Reasons to call the Doctor:  Fever - Oral temperature greater than 100.4 degrees Fahrenheit Foul-smelling vaginal discharge Difficulty urinating Nausea and vomiting Difficulty breathing with or without chest pain New calf pain especially if only on one side Sudden, continuing increased vaginal bleeding with or without clots.   Follow-up: 1. You will have a visit with Mary Juarez on 2/24.  Contacts: For questions or concerns you should contact:  Mary Juarez at [phone] After hours and on week-ends call [phone] and ask to speak to the physician on call for Gynecologic Oncology     Post Anesthesia Home Care Instructions  Activity: Get plenty of rest for the remainder of the day. A responsible individual must stay with you for 24 hours following the procedure.  For the next 24 hours, DO NOT: -Drive a car -Advertising copywriter -Drink alcoholic beverages -Take any medication unless instructed by your physician -Make any legal decisions or sign important papers.  Meals: Start with liquid foods such as gelatin or soup. Progress to regular foods as tolerated. Avoid  greasy, spicy, heavy foods. If nausea and/or vomiting occur, drink only clear liquids until the nausea and/or vomiting subsides. Call your physician if vomiting continues.  Special Instructions/Symptoms: Your throat may feel dry or sore from the anesthesia or the breathing tube placed in your throat during surgery. If this causes discomfort, gargle with warm salt water. The discomfort should disappear within 24 hours.  If you had a scopolamine patch placed behind your ear for the management of post- operative nausea and/or vomiting:  1. The medication in the patch is effective for 72 hours, after which it should be removed.  Wrap patch in a tissue and discard in the trash. Wash hands thoroughly with soap and water. 2. You may remove the patch earlier than 72 hours if you experience unpleasant side effects which may include dry mouth, dizziness or visual disturbances. 3. Avoid touching the patch. Wash your hands with soap and water after contact with the patch.      Do not take any Tylenol until after 2:45 pm today. Do not take any nonsteroidal anti inflammatories until after 5:00 pm today.

## 2022-01-19 NOTE — Op Note (Signed)
OPERATIVE NOTE  PATIENT: Mary Juarez DATE: 01/19/22   Preop Diagnosis: PMB, thickened endometrium  Postoperative Diagnosis: Atrophic endometrium, some prominence of endocervical tissue, adhesions of the upper vagina  Surgery: Lysis of vagina adhesions, EUA, dilation of cervix under ultrasound guidance, hysteroscopy and endometrial and endocervical sampling with the Myosure Reach  Surgeons:  Carin Hock, MD  Assistant: none  Anesthesia: General   Estimated blood loss: 20 ml  IVF:  see I&O flowsheet   Urine output: n/a   Complications: None apparent  Pathology: endometrial curetteings, endocervical curettings.  Operative findings: ON vaginal exam, adhesions between the anterior and posterior vagina 1-2 cm distal to the cervix, easily lysed bluntly. Cervix atrophic and normal appearing. Small mobile uterus. On ultrasound, small anteverted uterus, sounded to just over 3 cm. Diagnostic hysteroscope noted very atrophic endometrium. Small area of fluffy tissue along anterior endocervix. Otherwise, endocervix atrophic.  Procedure: The patient was identified in the preoperative holding area. Informed consent was signed on the chart. Patient was seen history was reviewed and exam was performed.   The patient was then taken to the operating room and placed in the supine position with SCD hose on. General anesthesia was then induced without difficulty. She was then placed in the dorsolithotomy position. The perineum was prepped with Betadine. The vagina was prepped with Betadine. The patient was then draped after the prep was dried.   Timeout was performed the patient, procedure, antibiotic, allergy, and length of procedure. Vaginal exam was performed and adhesions lysed.  The speculum was placed in the posterior vagina. The single tooth tenaculum was placed on the anterior lip of the cervix. The uterine sound was placed in the cervix and advanced to the fundus under ultrasound  guidance. The cervix was successively dilated using pratts dilators to 10F.  Diagnostic hysteroscope was placed through the cervix and into the uterus with findings noted above. The diagnostic hysteroscope was replaced with the Myosure scope and curettings were performed from the endometrial cavity as well as the endocervix, sent separately.   The tenaculum was removed and hemostasis was achieve with monopolar electrocautery.   The vagina was irrigated.  All instrument, suture, laparotomy, Ray-Tec, and needle counts were correct x2. The patient tolerated the procedure well and was taken recovery room in stable condition.   Carver Fila, MD

## 2022-01-19 NOTE — Anesthesia Preprocedure Evaluation (Signed)
Anesthesia Evaluation    Reviewed: Allergy & Precautions, Patient's Chart, lab work & pertinent test results  History of Anesthesia Complications Negative for: history of anesthetic complications  Airway Mallampati: II  TM Distance: >3 FB Neck ROM: Full    Dental no notable dental hx. 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, 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- normal levels, slightly elevated ptt at 40 (upper limit 36)   Anesthesia Other Findings   Reproductive/Obstetrics negative OB ROS                             Anesthesia Physical Anesthesia Plan  ASA: 2  Anesthesia Plan: General   Post-op Pain Management: Toradol IV (intra-op) and Tylenol PO (pre-op)   Induction: Intravenous  PONV Risk Score and Plan: 4 or greater and Ondansetron, Dexamethasone, Midazolam and Treatment may vary due to age or medical condition  Airway Management Planned: LMA  Additional Equipment: None  Intra-op Plan:   Post-operative Plan: Extubation in OR  Informed Consent:   Plan Discussed with:   Anesthesia Plan Comments:         Anesthesia Quick Evaluation

## 2022-01-19 NOTE — Anesthesia Procedure Notes (Signed)
Procedure Name: LMA Insertion Date/Time: 01/19/2022 10:27 AM Performed by: Genelle Bal, CRNA Pre-anesthesia Checklist: Patient identified, Emergency Drugs available, Suction available and Patient being monitored Patient Re-evaluated:Patient Re-evaluated prior to induction Oxygen Delivery Method: Circle system utilized Preoxygenation: Pre-oxygenation with 100% oxygen Induction Type: IV induction Ventilation: Mask ventilation without difficulty LMA: LMA inserted LMA Size: 4.0 Number of attempts: 1 Airway Equipment and Method: Bite block Placement Confirmation: positive ETCO2 Tube secured with: Tape Dental Injury: Teeth and Oropharynx as per pre-operative assessment  Comments: Very small mouth opening. All air removed from LMA cuff prior to insertion as previously recc'd in airway note from prior sx. Easy atraumatic insertion of #4 LMA.

## 2022-01-19 NOTE — Transfer of Care (Signed)
Immediate Anesthesia Transfer of Care Note  Patient: Mary Juarez April  Procedure(s) Performed: Jasmine December UNDER ANESTHESIA (Vagina ) DILATATION & CURETTAGE/HYSTEROSCOPY WITH MYOSURE (Vagina ) POSSIBLE OPERATIVE ULTRASOUND GUIDANCE (Vagina )  Patient Location: PACU  Anesthesia Type:General  Level of Consciousness: awake, alert  and oriented  Airway & Oxygen Therapy: Patient Spontanous Breathing and Patient connected to face mask oxygen  Post-op Assessment: Report given to RN and Post -op Vital signs reviewed and stable  Post vital signs: Reviewed and stable  Last Vitals:  Vitals Value Taken Time  BP 127/58 01/19/22 1110  Temp    Pulse 76 01/19/22 1113  Resp 19 01/19/22 1113  SpO2 100 % 01/19/22 1113  Vitals shown include unvalidated device data.  Last Pain:  Vitals:   01/19/22 0843  TempSrc: Oral  PainSc: 0-No pain      Patients Stated Pain Goal: 4 ([id number])  Complications: No notable events documented.

## 2022-01-19 NOTE — Interval H&P Note (Signed)
History and Physical Interval Note:  01/19/2022 10:21 AM  Mary Juarez  has presented today for surgery, with the diagnosis of THICKENED ENDOMETRIUM POSTMENOPAUSAL BLEEDING.  The various methods of treatment have been discussed with the patient and family. After consideration of risks, benefits and other options for treatment, the patient has consented to  Procedure(s): EXAM UNDER ANESTHESIA (N/A) DILATATION & CURETTAGE/HYSTEROSCOPY WITH MYOSURE (N/A) POSSIBLE OPERATIVE ULTRASOUND GUIDANCE (N/A) as a surgical intervention.  The patient's history has been reviewed, patient examined, no change in status, stable for surgery.  I have reviewed the patient's chart and labs.  Questions were answered to the patient's satisfaction.     Carver Fila

## 2022-01-20 ENCOUNTER — Encounter (HOSPITAL_BASED_OUTPATIENT_CLINIC_OR_DEPARTMENT_OTHER): Payer: Self-pay | Admitting: Gynecologic Oncology

## 2022-01-20 ENCOUNTER — Telehealth: Payer: Self-pay

## 2022-01-20 LAB — SURGICAL PATHOLOGY

## 2022-01-20 NOTE — Telephone Encounter (Signed)
Spoke with Mary Juarez this morning. She states she is eating, drinking and urinating well. She has not had a BM yet and is not passing gas. She denies fever or chills. She rates her pain 1.5-2/10. Her pain is controlled with tylenol.   Patient is very eager to receive pathology results and hear from Dr. Berline Lopes. Pt was instructed that our office will give her a call back when the results are received and reviewed by Dr. Berline Lopes. I reviewed with the patient that she is able to return to work in 1-2 days if applicable. Pt is aware to call our office if she has any concerns before she returns to work on 01/25/2022.  Instructed to call office with any fever, chills, purulent drainage, uncontrolled pain or any other questions or concerns. Patient verbalizes understanding.   Pt aware of post op appointments as well as the office number [phone] and after hours number [phone] to call if she has any questions or concerns.

## 2022-01-20 NOTE — Telephone Encounter (Signed)
Spoke with Mary Juarez this morning regarding her surgical pathology results. Pt informed that per Dr. Pricilla Holm, the pathology results were benign and the tissue was atrophic, which happens after menopause. Patient is very appreciative for my call and the great news. She is aware of upcoming appts with our office.

## 2022-01-22 ENCOUNTER — Telehealth: Payer: Self-pay

## 2022-01-22 NOTE — Telephone Encounter (Signed)
Returning call to Mary Juarez this morning regarding vaginal bleeding. Patient reports having surgery on 01/19/22 and had increased vaginal bleeding at 6 pm last night. " I walked across the room and felt the blood coming out. I soaked thru my depends and got blood on my clothes. When I went to the bathroom there was something in the toilet maybe a clot." She reports resting the rest of the evening. She had a large BM this morning and is having some spotting of "fresh" blood. She is unsure how heavy the bleeding is as she is resting and does not want to move and have the bleeding increase. "I am wearing two depends because I am so worried about the bleeding." Patient denies any pain or cramping. She denies fever but had a slight chill this morning. Warner Mccreedy, NP notified. Advised patient to monitor bleeding. If bleeding becomes heavy like a period, she has increasing pain or fever to contact our office. Patient verbalized understanding. She states she has the office number as well as the after hours number and to call with any concerns.

## 2022-01-26 ENCOUNTER — Encounter: Payer: Self-pay | Admitting: *Deleted

## 2022-01-26 ENCOUNTER — Telehealth: Payer: Self-pay | Admitting: *Deleted

## 2022-01-26 NOTE — Telephone Encounter (Signed)
Patient called and stated "first I got a message about an appt tomorrow, but I thought is was a telephone appt. I need to have a new work note stating I can return with no limitations or restrictions. Mary Juarez wouldn't take the earlier note. Also I'm not feeling well. I have some bleeding but it is dark red blood. I am not soaking pads, have no pain just some cramping.tThe pain level is like a 2-3. I have been taking tylenol and it has helped. But I will discuss this tomorrow with Mary Juarez."   Explained to the patient that the automated system can't figure out in person vs telephone appts. Explained that her appt is a telephone appt tomorrow wit Mary Juarez. Explained that Mary Juarez will be in surgery tomorrow and the call may be later than 4:20 pm but that she will call her once she is out of surgery. Explained that the office will send a new work release to her email per her request.

## 2022-01-27 ENCOUNTER — Encounter: Payer: Self-pay | Admitting: Gynecologic Oncology

## 2022-01-27 ENCOUNTER — Inpatient Hospital Stay: Payer: No Typology Code available for payment source | Attending: Gynecologic Oncology | Admitting: Gynecologic Oncology

## 2022-01-27 DIAGNOSIS — F419 Anxiety disorder, unspecified: Secondary | ICD-10-CM | POA: Insufficient documentation

## 2022-01-27 DIAGNOSIS — N950 Postmenopausal bleeding: Secondary | ICD-10-CM

## 2022-01-27 DIAGNOSIS — N858 Other specified noninflammatory disorders of uterus: Secondary | ICD-10-CM

## 2022-01-27 DIAGNOSIS — Z7189 Other specified counseling: Secondary | ICD-10-CM

## 2022-01-27 DIAGNOSIS — N952 Postmenopausal atrophic vaginitis: Secondary | ICD-10-CM

## 2022-01-27 NOTE — Progress Notes (Signed)
Gynecologic Oncology Telehealth Consult Note: Gyn-Onc  I connected with Mary Juarez on 01/27/22 at  4:20 PM EST by telephone and verified that I am speaking with the correct person using two identifiers.  I discussed the limitations, risks, security and privacy concerns of performing an evaluation and management service by telemedicine and the availability of in-person appointments. I also discussed with the patient that there may be a patient responsible charge related to this service. The patient expressed understanding and agreed to proceed.  Other persons participating in the visit and their role in the encounter: none.  Patient's location: home Provider's location: [facility]  Reason for Visit: f/u after surgery, treatment discussion  Treatment History: 01/19/22: Lysis of vagina adhesions, EUA, dilation of cervix under ultrasound guidance, hysteroscopy and endometrial and endocervical sampling with the Myosure Reach  Interval History: Doing well. Have some older appearing intermittent spotting (mostly when she gets up to moving or gets up after being in bed or sitting). Mild cramping.  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   Complication of anesthesia    per pt was very hypotensive w/ d&c hysteroscopy in 1990s in [place],  no issue the next surgery early 2000s don @[facility]    Depression    Frequency of urination    History of chest pain    evaulation by cardiologist,  dr ,  ekg normal, ETT 06-25-2015 normal (in epic),  atypical chest pain no further work-up   History of concussion    per pt has had few times , last time at age 67,  no residual   PMB (postmenopausal bleeding)    Thickened endometrium    Wears glasses     Past Surgical History:  Procedure Laterality Date   CERVICAL POLYPECTOMY  early 2000s   dr 01-23-1974 @[facility]    COLONOSCOPY  2012 approx.   DIAGNOSTIC  LAPAROSCOPY  1970s   for painful menses   DILATATION & CURETTAGE/HYSTEROSCOPY WITH MYOSURE N/A 03/17/2021   Procedure: DILATATION & CURETTAGE/HYSTEROSCOPY WITH MYOSURE;  Surgeon: 2013, MD;  Location: [facility];  Service: Gynecology;  Laterality: N/A;   DILATATION & CURETTAGE/HYSTEROSCOPY WITH MYOSURE N/A 01/19/2022   Procedure: DILATATION & CURETTAGE/HYSTEROSCOPY WITH MYOSURE;  Surgeon: [facility] MD;  Location: [facility];  Service: Gynecology;  Laterality: N/A;   DILATION AND CURETTAGE OF UTERUS  1970s   HYSTEROSCOPY WITH D & C  1990s   NASAL FRACTURE SURGERY  age 84   OPERATIVE ULTRASOUND N/A 03/17/2021   Procedure: OPERATIVE ULTRASOUND;  Surgeon: 12, MD;  Location: [facility];  Service: Gynecology;  Laterality: N/A;   OPERATIVE ULTRASOUND N/A 01/19/2022   Procedure: POSSIBLE OPERATIVE ULTRASOUND GUIDANCE;  Surgeon: [facility] MD;  Location: [facility];  Service: Gynecology;  Laterality: N/A;    Family History  Problem Relation Age of Onset   Stroke Mother    Heart disease Mother        pacemaker   Heart attack Father    Kidney failure Father    Heart disease Maternal Grandmother        pacemaker   Heart attack Maternal Grandfather    Sudden death Maternal Grandfather    Cancer Paternal Grandmother        oral - tobacco   Heart attack Paternal Grandfather    Sudden death Paternal Grandfather  Heart disease Brother        secondary cardiomyopathy, PVCs, atherosclerosis of native artery, syncope & collapse - sees cardiologist    Social History   Socioeconomic History   Marital status: Married    Spouse name: Not on file   Number of children: Not on file   Years of education: Not on file   Highest education level: Not on file  Occupational History   Occupation: Conservation officer, nature at grocery store  Tobacco Use   Smoking status: Never   Smokeless tobacco: Never  Vaping  Use   Vaping Use: Never used  Substance and Sexual Activity   Alcohol use: No   Drug use: Never   Sexual activity: Not Currently    Birth control/protection: Post-menopausal  Other Topics Concern   Not on file  Social History Narrative   Not on file   Social Determinants of Health   Financial Resource Strain: Not on file  Food Insecurity: Not on file  Transportation Needs: Not on file  Physical Activity: Not on file  Stress: Not on file  Social Connections: Not on file    Current Medications:  Current Outpatient Medications:    acetaminophen (TYLENOL) 500 MG tablet, Take 500-1,000 mg by mouth every 6 (six) hours as needed for mild pain, moderate pain, fever or headache., Disp: , Rfl:    calcium-vitamin D (OSCAL WITH D) 500-200 MG-UNIT per tablet, Take 2 tablets by mouth at bedtime., Disp: , Rfl:    clobetasol ointment (TEMOVATE) 0.05 %, APPLY TO AFFECTED AREA TWICE A DAY AS NEEDED, Disp: , Rfl:    clotrimazole-betamethasone (LOTRISONE) cream, Apply topically 2 (two) times daily as needed., Disp: , Rfl:    diclofenac Sodium (VOLTAREN) 1 % GEL, 4 (four) times daily as needed., Disp: , Rfl:    hydrocortisone 2.5 % cream, Apply 1 application topically daily., Disp: , Rfl:    Multiple Vitamins-Minerals (MULTIVITAMIN ADULT, MINERALS, PO), Take 1 tablet by mouth daily., Disp: , Rfl:    venlafaxine XR (EFFEXOR-XR) 37.5 MG 24 hr capsule, Take 37.5 mg by mouth at bedtime., Disp: , Rfl:   Review of Symptoms: Pertinent positives as per HPI.  Physical Exam: There were no vitals taken for this visit. Deferred given phone limitations  Laboratory & Radiologic Studies: A. ENDOMETRIUM, CURETTAGE:  - Benign inactive endometrium  - Negative for hyperplasia or malignancy   B. ENDOCERVIX, CURETTAGE:  - Benign cervical glandular mucosa  - Negative for dysplasia or malignancy   Assessment & Plan: Mary Juarez is a 66 y.o. woman s/p D&C in the setting of recurrent postmenopausal bleeding  with benign pathology.  Discussed recovery. Reassurance given. Discussed continued expectations.  The patient is requesting clarification for work note. New note was sent yesterday with release to work and date (without restriction). She thinks employer may need the letter to state "release" instead of "return" but will let us know after she submits the letter.   Reviewed findings during surgery. Given significant vaginal and uterine atrophy, I think the patient would benefit from vaginal estrogen to hopefully prevent future bleeding episodes. 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   I discussed the assessment and treatment plan with the patient. The patient was provided with an opportunity to ask questions and all were answered. The patient agreed with the plan and demonstrated an understanding of the instructions.   The patient was advised to call back or see an in-person  evaluation if the symptoms worsen or if the condition fails to improve as anticipated.   16 minutes of total time was spent for this patient encounter, including preparation, face-to-face counseling with the patient and coordination of care, and documentation of the encounter.   Eugene Garnet, MD  Division of Gynecologic Oncology  Department of Obstetrics and Gynecology  [facility]

## 2022-02-11 NOTE — Progress Notes (Signed)
Gynecologic Oncology Return Clinic Visit  02/12/22  Reason for Visit: follow-up after recent surgery, treatment discussion  Treatment History: 01/19/22: Lysis of vagina adhesions, EUA, dilation of cervix under ultrasound guidance, hysteroscopy and endometrial and endocervical sampling with the Myosure Reach. Pathology revealed benign inactive endometrium and benign cervical glandular mucosa. No dysplasia, hyperplasia, or malignancy.  Interval History: Overall doing well.  Had some bleeding earlier this week while she was at work.  Anxiety and mood has been up and down, which she was not expecting after surgery.  Given her experience after her last surgery, she expected to "bounce back" sooner.  Logically feels that she should be happy about surgery and the results.  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   Complication of anesthesia    per pt was very hypotensive w/ d&c hysteroscopy in 1990s in [place],  no issue the next surgery early 2000s don @[facility]    Depression    Frequency of urination    History of chest pain    evaulation by cardiologist,  dr Debara Pickett,  ekg normal, ETT 06-25-2015 normal (in epic),  atypical chest pain no further work-up   History of concussion    per pt has had few times , last time at age 75,  no residual   PMB (postmenopausal bleeding)    Thickened endometrium    Wears glasses     Past Surgical History:  Procedure Laterality Date   CERVICAL POLYPECTOMY  early 2000s   dr Jarrett Soho @[facility]    COLONOSCOPY  2012 approx.   DIAGNOSTIC LAPAROSCOPY  1970s   for painful menses   DILATATION & CURETTAGE/HYSTEROSCOPY WITH MYOSURE N/A 03/17/2021   Procedure: DILATATION & CURETTAGE/HYSTEROSCOPY WITH MYOSURE;  Surgeon: Lafonda Mosses, MD;  Location: [facility];  Service: Gynecology;  Laterality: N/A;   DILATATION & CURETTAGE/HYSTEROSCOPY WITH MYOSURE N/A  01/19/2022   Procedure: DILATATION & CURETTAGE/HYSTEROSCOPY WITH MYOSURE;  Surgeon: Lafonda Mosses, MD;  Location: [facility];  Service: Gynecology;  Laterality: N/A;   DILATION AND CURETTAGE OF UTERUS  1970s   HYSTEROSCOPY WITH D & C  1990s   NASAL FRACTURE SURGERY  age 89   OPERATIVE ULTRASOUND N/A 03/17/2021   Procedure: OPERATIVE ULTRASOUND;  Surgeon: Lafonda Mosses, MD;  Location: [facility];  Service: Gynecology;  Laterality: N/A;   OPERATIVE ULTRASOUND N/A 01/19/2022   Procedure: POSSIBLE OPERATIVE ULTRASOUND GUIDANCE;  Surgeon: Lafonda Mosses, MD;  Location: [facility];  Service: Gynecology;  Laterality: N/A;    Family History  Problem Relation Age of Onset   Stroke Mother    Heart disease Mother        pacemaker   Heart attack Father    Kidney failure Father    Heart disease Maternal Grandmother        pacemaker   Heart attack Maternal Grandfather    Sudden death Maternal Grandfather    Cancer Paternal Grandmother        oral - tobacco   Heart attack Paternal Grandfather    Sudden death Paternal Grandfather    Heart disease Brother        secondary cardiomyopathy, PVCs, atherosclerosis of native artery, syncope & collapse - sees cardiologist    Social History   Socioeconomic History   Marital status: Married    Spouse name: Not on file   Number of  children: Not on file   Years of education: Not on file   Highest education level: Not on file  Occupational History   Occupation: Conservation officer, nature at grocery store  Tobacco Use   Smoking status: Never   Smokeless tobacco: Never  Vaping Use   Vaping Use: Never used  Substance and Sexual Activity   Alcohol use: No   Drug use: Never   Sexual activity: Not Currently    Birth control/protection: Post-menopausal  Other Topics Concern   Not on file  Social History Narrative   Not on file   Social Determinants of Health   Financial Resource Strain: Not on file   Food Insecurity: Not on file  Transportation Needs: Not on file  Physical Activity: Not on file  Stress: Not on file  Social Connections: Not on file    Current Medications:  Current Outpatient Medications:    acetaminophen (TYLENOL) 500 MG tablet, Take 500-1,000 mg by mouth every 6 (six) hours as needed for mild pain, moderate pain, fever or headache., Disp: , Rfl:    calcium-vitamin D (OSCAL WITH D) 500-200 MG-UNIT per tablet, Take 2 tablets by mouth at bedtime., Disp: , Rfl:    clobetasol ointment (TEMOVATE) 0.05 %, APPLY TO AFFECTED AREA TWICE A DAY AS NEEDED, Disp: , Rfl:    diclofenac Sodium (VOLTAREN) 1 % GEL, 4 (four) times daily as needed., Disp: , Rfl:    estradiol (ESTRACE VAGINAL) 0.1 MG/GM vaginal cream, Place 1 Applicatorful vaginally 3 (three) times a week. Place a small amount of estrogen cream in your vagina 3x a week at night, Disp: 42.5 g, Rfl: 12   hydrocortisone 2.5 % cream, Apply 1 application topically daily., Disp: , Rfl:    Multiple Vitamins-Minerals (MULTIVITAMIN ADULT, MINERALS, PO), Take 1 tablet by mouth daily., Disp: , Rfl:    venlafaxine XR (EFFEXOR-XR) 37.5 MG 24 hr capsule, Take 37.5 mg by mouth at bedtime., Disp: , Rfl:    clotrimazole-betamethasone (LOTRISONE) cream, Apply topically 2 (two) times daily as needed. (Patient not taking: Reported on 02/12/2022), Disp: , Rfl:   Review of Systems: Denies appetite changes, fevers, chills, fatigue, unexplained weight changes. Denies hearing loss, neck lumps or masses, mouth sores, ringing in ears or voice changes. Denies cough or wheezing.  Denies shortness of breath. Denies chest pain or palpitations. Denies leg swelling. Denies abdominal distention, pain, blood in stools, constipation, diarrhea, nausea, vomiting, or early satiety. Denies pain with intercourse, dysuria, frequency, hematuria or incontinence. Denies hot flashes, pelvic pain, vaginal bleeding or vaginal discharge.   Denies joint pain, back pain  or muscle pain/cramps. Denies itching, rash, or wounds. Denies dizziness, headaches, numbness or seizures. Denies swollen lymph nodes or glands, denies easy bruising or bleeding. Denies confusion, or decreased concentration.  Physical Exam: BP (!) 123/57 (BP Location: Right Arm, Patient Position: Sitting)    Pulse 72    Temp 98.2 F (36.8 C) (Oral)    Resp 14    Ht 5' 4.57" (1.64 m)    Wt 134 lb 12.8 oz (61.1 kg)    SpO2 98%    BMI 22.73 kg/m  General: Alert, oriented, no acute distress. HEENT: Normocephalic, atraumatic, sclera anicteric. Chest: Unlabored breathing on room air.  Laboratory & Radiologic Studies: A. ENDOMETRIUM, CURETTAGE:  - Benign inactive endometrium  - Negative for hyperplasia or malignancy   B. ENDOCERVIX, CURETTAGE:  - Benign cervical glandular mucosa  - Negative for dysplasia or malignancy   Assessment & Plan: Mary Juarez is a 66 y.o.  woman  s/p D&C in the setting of recurrent postmenopausal bleeding with benign pathology although significant atrophy noted during surgery.  The patient is overall doing well from a postoperative standpoint.  I provided reassurance that the experience after each surgery can be different, even if the patient undergoes the same or similar procedure.  We also discussed the difficulty sometimes with the results not showing that something is wrong or that there is something to treat.  I tried to stress that despite the results being benign, I do think that we have something to treat and this is her vaginal and likely endometrial atrophy.  We reviewed again the findings from surgery as well as her pathology.  I recommend, to hopefully prevent future bleeding episodes, that we start vaginal estrogen.  Prescription was sent to the patient's pharmacy and we discussed how to use as well as the its use as well as the frequency of use.  28 minutes of total time was spent for this patient encounter, including preparation, face-to-face  counseling with the patient and coordination of care, and documentation of the encounter.  Jeral Pinch, MD  Division of Gynecologic Oncology  Department of Obstetrics and Gynecology  [facility]

## 2022-02-12 ENCOUNTER — Inpatient Hospital Stay (HOSPITAL_BASED_OUTPATIENT_CLINIC_OR_DEPARTMENT_OTHER): Payer: No Typology Code available for payment source | Admitting: Gynecologic Oncology

## 2022-02-12 ENCOUNTER — Other Ambulatory Visit: Payer: Self-pay

## 2022-02-12 ENCOUNTER — Encounter: Payer: Self-pay | Admitting: Gynecologic Oncology

## 2022-02-12 VITALS — BP 123/57 | HR 72 | Temp 98.2°F | Resp 14 | Ht 64.57 in | Wt 134.8 lb

## 2022-02-12 DIAGNOSIS — N950 Postmenopausal bleeding: Secondary | ICD-10-CM

## 2022-02-12 DIAGNOSIS — N952 Postmenopausal atrophic vaginitis: Secondary | ICD-10-CM

## 2022-02-12 DIAGNOSIS — Z9889 Other specified postprocedural states: Secondary | ICD-10-CM

## 2022-02-12 MED ORDER — ESTRADIOL 0.1 MG/GM VA CREA: 1.0000 | TOPICAL_CREAM | VAGINAL | 12 refills | Status: DC

## 2022-02-12 NOTE — Patient Instructions (Addendum)
It was good to see you today.  I am glad to hear you are doing well after surgery.  Recommendations for GYNs include:    Dr. Leda Quail at [facility] at [place] [phone]   Dr. Conley Simmonds with [facility] [place] at [phone]

## 2022-04-05 DIAGNOSIS — B372 Candidiasis of skin and nail: Secondary | ICD-10-CM | POA: Diagnosis not present

## 2022-04-12 ENCOUNTER — Ambulatory Visit
Admission: RE | Admit: 2022-04-12 | Discharge: 2022-04-12 | Disposition: A | Discharge status: Home / Self Care | Payer: No Typology Code available for payment source | Source: Ambulatory Visit | Attending: Physician Assistant | Admitting: Physician Assistant

## 2022-04-12 DIAGNOSIS — I700 Atherosclerosis of aorta: Secondary | ICD-10-CM | POA: Diagnosis not present

## 2022-04-12 DIAGNOSIS — Q2546 Tortuous aortic arch: Secondary | ICD-10-CM | POA: Diagnosis not present

## 2022-04-12 DIAGNOSIS — Q438 Other specified congenital malformations of intestine: Secondary | ICD-10-CM

## 2022-04-12 DIAGNOSIS — K769 Liver disease, unspecified: Secondary | ICD-10-CM | POA: Diagnosis not present

## 2022-04-21 ENCOUNTER — Other Ambulatory Visit: Payer: Self-pay | Admitting: Gastroenterology

## 2022-04-21 DIAGNOSIS — K7689 Other specified diseases of liver: Secondary | ICD-10-CM

## 2022-05-15 ENCOUNTER — Ambulatory Visit
Admission: RE | Admit: 2022-05-15 | Discharge: 2022-05-15 | Disposition: A | Discharge status: Home / Self Care | Payer: Medicare HMO | Source: Ambulatory Visit | Attending: Gastroenterology | Admitting: Gastroenterology

## 2022-05-15 DIAGNOSIS — K7689 Other specified diseases of liver: Secondary | ICD-10-CM | POA: Diagnosis not present

## 2022-05-15 DIAGNOSIS — R932 Abnormal findings on diagnostic imaging of liver and biliary tract: Secondary | ICD-10-CM | POA: Diagnosis not present

## 2022-05-15 DIAGNOSIS — N281 Cyst of kidney, acquired: Secondary | ICD-10-CM | POA: Diagnosis not present

## 2022-05-15 DIAGNOSIS — R935 Abnormal findings on diagnostic imaging of other abdominal regions, including retroperitoneum: Secondary | ICD-10-CM | POA: Diagnosis not present

## 2022-05-15 MED ORDER — GADOBENATE DIMEGLUMINE 529 MG/ML IV SOLN
12.0000 mL | Freq: Once | INTRAVENOUS | Status: AC | PRN
  Administered 2022-05-15: 12 mL via INTRAVENOUS

## 2022-06-21 DIAGNOSIS — N281 Cyst of kidney, acquired: Secondary | ICD-10-CM | POA: Diagnosis not present

## 2022-09-22 DIAGNOSIS — Z6823 Body mass index (BMI) 23.0-23.9, adult: Secondary | ICD-10-CM | POA: Diagnosis not present

## 2022-09-22 DIAGNOSIS — Z1159 Encounter for screening for other viral diseases: Secondary | ICD-10-CM | POA: Diagnosis not present

## 2022-09-22 DIAGNOSIS — K7689 Other specified diseases of liver: Secondary | ICD-10-CM | POA: Diagnosis not present

## 2022-09-22 DIAGNOSIS — R634 Abnormal weight loss: Secondary | ICD-10-CM | POA: Diagnosis not present

## 2022-10-04 ENCOUNTER — Other Ambulatory Visit: Payer: Self-pay | Admitting: Family Medicine

## 2022-10-04 DIAGNOSIS — Z1231 Encounter for screening mammogram for malignant neoplasm of breast: Secondary | ICD-10-CM

## 2022-10-05 ENCOUNTER — Ambulatory Visit
Admission: RE | Admit: 2022-10-05 | Discharge: 2022-10-05 | Disposition: A | Discharge status: Home / Self Care | Payer: Medicare HMO | Source: Ambulatory Visit | Attending: Family Medicine | Admitting: Family Medicine

## 2022-10-05 DIAGNOSIS — Z1231 Encounter for screening mammogram for malignant neoplasm of breast: Secondary | ICD-10-CM | POA: Diagnosis not present

## 2022-11-22 DIAGNOSIS — K7689 Other specified diseases of liver: Secondary | ICD-10-CM | POA: Diagnosis not present

## 2023-04-18 DIAGNOSIS — Z13220 Encounter for screening for lipoid disorders: Secondary | ICD-10-CM | POA: Diagnosis not present

## 2023-04-18 DIAGNOSIS — Z79899 Other long term (current) drug therapy: Secondary | ICD-10-CM | POA: Diagnosis not present

## 2023-04-18 DIAGNOSIS — Z131 Encounter for screening for diabetes mellitus: Secondary | ICD-10-CM | POA: Diagnosis not present

## 2023-04-18 DIAGNOSIS — Z0000 Encounter for general adult medical examination without abnormal findings: Secondary | ICD-10-CM | POA: Diagnosis not present

## 2023-06-27 DIAGNOSIS — Z0100 Encounter for examination of eyes and vision without abnormal findings: Secondary | ICD-10-CM | POA: Diagnosis not present

## 2023-09-07 ENCOUNTER — Other Ambulatory Visit: Payer: Self-pay | Admitting: Family Medicine

## 2023-09-07 DIAGNOSIS — Z1231 Encounter for screening mammogram for malignant neoplasm of breast: Secondary | ICD-10-CM

## 2023-09-21 DIAGNOSIS — K7689 Other specified diseases of liver: Secondary | ICD-10-CM | POA: Diagnosis not present

## 2023-10-07 ENCOUNTER — Encounter: Payer: Self-pay | Admitting: Podiatry

## 2023-10-07 ENCOUNTER — Ambulatory Visit (INDEPENDENT_AMBULATORY_CARE_PROVIDER_SITE_OTHER): Payer: No Typology Code available for payment source

## 2023-10-07 ENCOUNTER — Ambulatory Visit (INDEPENDENT_AMBULATORY_CARE_PROVIDER_SITE_OTHER): Payer: No Typology Code available for payment source | Admitting: Podiatry

## 2023-10-07 DIAGNOSIS — M19071 Primary osteoarthritis, right ankle and foot: Secondary | ICD-10-CM

## 2023-10-07 DIAGNOSIS — Q6671 Congenital pes cavus, right foot: Secondary | ICD-10-CM | POA: Diagnosis not present

## 2023-10-07 DIAGNOSIS — G5761 Lesion of plantar nerve, right lower limb: Secondary | ICD-10-CM | POA: Diagnosis not present

## 2023-10-07 DIAGNOSIS — M7751 Other enthesopathy of right foot: Secondary | ICD-10-CM

## 2023-10-07 DIAGNOSIS — Q667 Congenital pes cavus, unspecified foot: Secondary | ICD-10-CM

## 2023-10-07 DIAGNOSIS — Q6672 Congenital pes cavus, left foot: Secondary | ICD-10-CM | POA: Diagnosis not present

## 2023-10-07 NOTE — Progress Notes (Signed)
Subjective:  Patient ID: Mary Juarez, female    DOB: 1956-04-08,  MRN: [id number]  Chief Complaint  Patient presents with   Myrtie Neither    RM#8 Patient states about 20 years or so having pain in right foot questioning hammer toe. Patient stand a lot at work working at [employer].    67 y.o. female presents with the above complaint.  Patient presents with multiple chronic foot issues to the right side.  Patient states that this has been on for over 20 years she used to be a runner.  She stands a lot at her scooter now.  She does a lot of walking.  She wanted to get evaluated she does not wear any orthotics she currently wears new balance shoes.  She is having pain to the midfoot.  She is also experienced some neuroma symptoms.  She also has pes cavus foot deformity.   Review of Systems: Negative except as noted in the HPI. Denies N/V/F/Ch.  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   Complication of anesthesia    per pt was very hypotensive w/ d&c hysteroscopy in 1990s in [place],  no issue the next surgery early 2000s don @[facility]    Depression    Frequency of urination    History of chest pain    evaulation by cardiologist,  dr Rennis Golden,  ekg normal, ETT 06-25-2015 normal (in epic),  atypical chest pain no further work-up   History of concussion    per pt has had few times , last time at age 48,  no residual   PMB (postmenopausal bleeding)    Thickened endometrium    Wears glasses     Current Outpatient Medications:    acetaminophen (TYLENOL) 500 MG tablet, Take 500-1,000 mg by mouth every 6 (six) hours as needed for mild pain, moderate pain, fever or headache., Disp: , Rfl:    calcium-vitamin D (OSCAL WITH D) 500-200 MG-UNIT per tablet, Take 2 tablets by mouth at bedtime., Disp: , Rfl:    clobetasol ointment (TEMOVATE) 0.05 %, APPLY TO AFFECTED AREA TWICE A DAY AS NEEDED, Disp: , Rfl:     clotrimazole-betamethasone (LOTRISONE) cream, Apply topically 2 (two) times daily as needed. (Patient not taking: Reported on 02/12/2022), Disp: , Rfl:    diclofenac Sodium (VOLTAREN) 1 % GEL, 4 (four) times daily as needed., Disp: , Rfl:    estradiol (ESTRACE VAGINAL) 0.1 MG/GM vaginal cream, Place 1 Applicatorful vaginally 3 (three) times a week. Place a small amount of estrogen cream in your vagina 3x a week at night, Disp: 42.5 g, Rfl: 12   hydrocortisone 2.5 % cream, Apply 1 application topically daily., Disp: , Rfl:    Multiple Vitamins-Minerals (MULTIVITAMIN ADULT, MINERALS, PO), Take 1 tablet by mouth daily., Disp: , Rfl:    venlafaxine XR (EFFEXOR-XR) 37.5 MG 24 hr capsule, Take 37.5 mg by mouth at bedtime., Disp: , Rfl:   Social History   Tobacco Use  Smoking Status Never  Smokeless Tobacco Never    Allergies  Allergen Reactions   Septra [Sulfamethoxazole-Trimethoprim] Other (See Comments)    Severe migraine    Adhesive [Tape] Rash   Latex Rash   Tamiflu [Oseltamivir] Nausea And Vomiting   Objective:  There were no vitals filed for this visit. There is no height or weight on file to calculate BMI. Constitutional Well developed. Well nourished.  Vascular Dorsalis pedis pulses palpable  bilaterally. Posterior tibial pulses palpable bilaterally. Capillary refill normal to all digits.  No cyanosis or clubbing noted. Pedal hair growth normal.  Neurologic Normal speech. Oriented to person, place, and time. Epicritic sensation to light touch grossly present bilaterally.  Dermatologic Nails well groomed and normal in appearance. No open wounds. No skin lesions.  Orthopedic: Gait examination shows pes cavus foot structure with calcaneal varus 7 flexible in nature.  Mild symptoms of neuroma noted at the second interspace with Lendell Caprice sign.  Pain on palpation to the midfoot.  Lisfranc interval intact.  Midfoot arthritis clinically appreciated.     Radiographs: 3 views of  skeletally mature on the right foot: Solomon sign noted decrease in intermetatarsal space between her second and third met noted.  Midfoot arthritis noted.  No other bony abnormalities identified Assessment:   1. Pes cavus   2. 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  Pes cavus -All questions and concerns were discussed with the patient extensive detail.  Given that patient has a high arch foot structure she will benefit from custom orthotics to help support the high arch as well as increased intermetatarsal space for the neuroma.  I discussed with patient she states understanding.  She would like to obtain orthotics -She was casted for orthotics.  Midfoot arthritis -All questions and concerns were discussed with the patient extensive detail I discussed reviewed modification.  She will continue conservative care with Voltaren gel.  If it continues to progress patient will need a steroid shot in the future.  Right second interspace neuroma -Continue shoe gear modification orthotics management.  At this time we will continue to clinically monitor.

## 2023-10-14 ENCOUNTER — Ambulatory Visit
Admission: RE | Admit: 2023-10-14 | Discharge: 2023-10-14 | Disposition: A | Discharge status: Home / Self Care | Payer: No Typology Code available for payment source | Source: Ambulatory Visit | Attending: Family Medicine | Admitting: Family Medicine

## 2023-10-14 DIAGNOSIS — Z1231 Encounter for screening mammogram for malignant neoplasm of breast: Secondary | ICD-10-CM

## 2023-10-18 ENCOUNTER — Other Ambulatory Visit: Payer: Self-pay | Admitting: Family Medicine

## 2023-10-18 DIAGNOSIS — R928 Other abnormal and inconclusive findings on diagnostic imaging of breast: Secondary | ICD-10-CM

## 2023-10-21 NOTE — Progress Notes (Signed)
Orthotic order placed  Will call and fit when in  [place]

## 2023-10-25 ENCOUNTER — Other Ambulatory Visit: Payer: Self-pay | Admitting: Family Medicine

## 2023-10-25 ENCOUNTER — Ambulatory Visit
Admission: RE | Admit: 2023-10-25 | Discharge: 2023-10-25 | Disposition: A | Discharge status: Home / Self Care | Payer: No Typology Code available for payment source | Source: Ambulatory Visit | Attending: Family Medicine | Admitting: Family Medicine

## 2023-10-25 DIAGNOSIS — R928 Other abnormal and inconclusive findings on diagnostic imaging of breast: Secondary | ICD-10-CM

## 2023-10-25 DIAGNOSIS — N6310 Unspecified lump in the right breast, unspecified quadrant: Secondary | ICD-10-CM

## 2023-10-27 ENCOUNTER — Ambulatory Visit
Admission: RE | Admit: 2023-10-27 | Discharge: 2023-10-27 | Disposition: A | Discharge status: Home / Self Care | Payer: No Typology Code available for payment source | Source: Ambulatory Visit | Attending: Family Medicine | Admitting: Family Medicine

## 2023-10-27 DIAGNOSIS — N6310 Unspecified lump in the right breast, unspecified quadrant: Secondary | ICD-10-CM

## 2023-10-27 DIAGNOSIS — R928 Other abnormal and inconclusive findings on diagnostic imaging of breast: Secondary | ICD-10-CM

## 2023-10-27 HISTORY — PX: BREAST BIOPSY: SHX20

## 2023-10-28 LAB — SURGICAL PATHOLOGY

## 2023-11-22 ENCOUNTER — Encounter: Payer: Self-pay | Admitting: Podiatry

## 2023-11-25 ENCOUNTER — Ambulatory Visit: Payer: No Typology Code available for payment source

## 2023-11-25 NOTE — Progress Notes (Signed)
Patient presents today to pick up custom molded foot orthotics, diagnosed with Pes Cavus by Dr. Allena Katz.   Orthotics were dispensed and fit was satisfactory. Reviewed instructions for break-in and wear. Written instructions given to patient.  Patient will follow up as needed.   Addison Bailey Cped, CFo, CFm

## 2023-12-07 ENCOUNTER — Ambulatory Visit: Payer: No Typology Code available for payment source

## 2023-12-07 NOTE — Progress Notes (Signed)
Orthotics were squeaking in shoes I narrowed the heels and overall width squeaking quit patient is happy and will return if any other problems arise  Nicki Guadalajara

## 2023-12-23 ENCOUNTER — Other Ambulatory Visit: Payer: Self-pay | Admitting: Family Medicine

## 2023-12-23 DIAGNOSIS — N6310 Unspecified lump in the right breast, unspecified quadrant: Secondary | ICD-10-CM

## 2024-02-17 ENCOUNTER — Other Ambulatory Visit: Payer: Self-pay | Admitting: Obstetrics and Gynecology

## 2024-02-17 ENCOUNTER — Other Ambulatory Visit (HOSPITAL_COMMUNITY)
Admission: RE | Admit: 2024-02-17 | Discharge: 2024-02-17 | Disposition: A | Discharge status: Home / Self Care | Source: Ambulatory Visit | Attending: Obstetrics and Gynecology | Admitting: Obstetrics and Gynecology

## 2024-02-17 DIAGNOSIS — Z124 Encounter for screening for malignant neoplasm of cervix: Secondary | ICD-10-CM | POA: Diagnosis present

## 2024-02-23 LAB — CYTOLOGY - PAP
Comment: NEGATIVE
Comment: NEGATIVE
Comment: NEGATIVE
HPV 16: NEGATIVE
HPV 18 / 45: NEGATIVE
High risk HPV: POSITIVE — AB

## 2024-03-02 ENCOUNTER — Encounter: Payer: Self-pay | Admitting: Nurse Practitioner

## 2024-03-26 ENCOUNTER — Telehealth: Payer: Self-pay

## 2024-03-26 NOTE — Telephone Encounter (Signed)
 Spoke with [name] regarding her referral to GYN oncology. She has an appointment scheduled with Dr. Pricilla Holm on 04/12/24 at 8:00. Patient agrees to date and time. She has been provided with office address and location. She is also aware of our mask and visitor policy. Patient verbalized understanding and will call with any questions.

## 2024-04-05 ENCOUNTER — Encounter: Payer: Self-pay | Admitting: Gynecologic Oncology

## 2024-04-11 NOTE — Progress Notes (Unsigned)
 Gynecologic Oncology Return Clinic Visit  04/12/24  Reason for Visit: PMB, abnormal ultrasound  Treatment History: Patient endorses menopause at the age of 15-43.  She denies any postmenopausal bleeding until December 10 at which time she had sharp left-sided pelvic pain.  She then had some older appearing spotting.  She called and saw her primary care provider that day.  She was referred to gynecology and ultimately was able to schedule an appointment.  She had work-up including pelvic exam, Pap test, ultrasound, and the most recently attempt at endometrial biopsy.  Her pelvic ultrasound, performed at [facility] on 2/15, showed a uterus measuring 5 x 2.5 x 2.7 cm with an endometrial lining of 4.6 mm.  Ovaries were normal in appearance.  Myometrium noted to be normal.  Pap test showed negative for intraepithelial lesion, HPV negative.  Endometrial biopsy on 2/24 showed rare benign endometrial fragments in the specimen consisting mainly of mucus and endocervical glands.  Given her discomfort and intolerance of exams, the patient required premedication with lorazepam  and Tylenol  prior to both pelvic exams and her procedure.  Her endometrial biopsy had to be aborted early due to patient discomfort.  Only a small sample was able to be obtained.   Prior to the biopsy, she notes having what she describes as fluctuations in her hormone levels the past several months.  This was especially bothersome in January and she notes moodiness during this time.  With regard to her bleeding, she would have a spot of older appearing blood on the tissue paper when she wiped.  She also noted some bright red rectal bleeding that has been attributed to hemorrhoids.  She saw a gastroenterologist for this recently.  Since her attempted endometrial biopsy, she had a week of bright red bleeding, increased in quantity from what she was previously having.  Since Monday of this week, she denies any vaginal bleeding.   Overall,  patient notes that her appetite has been decreased for a number of weeks.  She is having to make herself eat.  She notes having early satiety and occasional bloating.  She reports about a 40 pound weight loss in the last 8-12 months.  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  She denies any attempt at weight loss.  She notes regular bowel movements which is aided by her high consumption of vegetables.  She has become more regular in the last several months.  She denies any changes to urination, with increased frequency especially at night which she has had for decades.   She describes a significant GYN history with very painful and heavy periods from a young age.  She saw multiple providers for this and ultimately no cause was found.  At points previously, she would run to help deal with and treat her pain.  She had laparoscopic surgery a number of years ago that she was thought perhaps to have endometriosis.  She was told after the surgery that she had extensive intra-abdominal scarring but the cause of this was unknown.  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  She had a previous attempted a D&C but this was abandoned due to inability to dilate the cervix and enter the uterine cavity.  She was placed on Megace subsequently for several months.  She also had a hysterosalpingogram at some point to check the patency of her fallopian tubes.  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   Patient underwent hysteroscopy with MyoSure sampling on 03/17/21 in the setting of postmenopausal bleeding.  Pathology revealed benign endometrial polyp, atrophic endometrium, no hyperplasia or malignancy.  She presented again in late 2022 with 3 day episode of heavy vaginal bleeding. Pelvic ultrasound showed thickened endometrium.  01/19/22: Lysis of vagina adhesions,  EUA, dilation of cervix under ultrasound guidance, hysteroscopy and endometrial and endocervical sampling with the Myosure Reach. Pathology revealed benign inactive endometrium and benign cervical glandular mucosa. No dysplasia, hyperplasia, or malignancy.   Interval History: Patient presented to see her OB/GYN in February and endorsed postmenopausal bleeding for 8 months.  She described this as brownish-yellow spotting with red discharge with associated menstrual-like cramps.  Pelvic ultrasound 02/15/24 to evaluated postmenopausal bleeding. Uterus 6.3 x 2.5 x 2.8 cm, endometrium 2.1 mm. LUS difficult to evaluate but appeared enlarge and lobular measuring 3 x 2.3 x 2.3 cm. Cervix not clearly identified. Neither ovary visualized, no adnexal masses.   Pap test was performed on 02/17/24: LSIL, HR HPV detected. This was performed due to lobular appearance of lower uterine segment on ultrasound. Cervix not visualized on exam so blind pap performed.   Today, the patient reports having vaginal spotting or discharge for quite a while before she saw her OB/GYN.  She describes this discharge is unpredictable, sometimes brown, sometimes red, sometimes bright red bleeding.  At times it will be larger quantities which she describes as a "glob" that she sees on her depends.  Sometimes she passes "brown junk".  After she urinates, and sometimes after she has a bowel movement, she sees brown material when she wipes that Edmund Gouge looks like what she would expect after wiping with a bowel movement.  She has not smelled this.  Bowel movements have been more regular than previously.  She endorses a bowel movement most days, majority of the time these are soft, sometimes hard pellets.  At the end of her bowel movements, there is sometimes bright red blood.  She sometimes has cramping with bowel movements, or before she experiences bleeding.  She endorses somewhat decreased appetite although makes herself eat.  Denies any changes  to weight.  Denies any nausea or emesis.  Colonoscopy was in 2023, virtual colonoscopy, no polyps or strictures.  She has recently seen GI for hepatic lesions.  MRI of the abdomen in October showed simple appearing hepatic cysts.  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   Complication of anesthesia    per pt was very hypotensive w/ d&c hysteroscopy in 1990s in [place],  no issue the next surgery early 2000s don @[facility]    Depression    Frequency of urination    History of chest pain    evaulation by cardiologist,  dr Maximo Spar,  ekg normal, ETT 06-25-2015 normal (in epic),  atypical chest pain no further work-up   History of concussion    per pt has had few times , last time at age 71,  no residual   PMB (postmenopausal bleeding)    Thickened endometrium    Wears glasses     Past Surgical History:  Procedure Laterality Date   BREAST BIOPSY Right 10/27/2023   US  RT BREAST BX  W LOC DEV 1ST LESION IMG BX SPEC US  GUIDE 10/27/2023 [facility]   CERVICAL POLYPECTOMY  early 2000s   dr Gena Kemp @[facility]    COLONOSCOPY  2012 approx.   DIAGNOSTIC LAPAROSCOPY  1970s   for painful menses   DILATATION & CURETTAGE/HYSTEROSCOPY WITH MYOSURE N/A 03/17/2021   Procedure: DILATATION & CURETTAGE/HYSTEROSCOPY WITH MYOSURE;  Surgeon: Suzi Essex, MD;  Location: [facility];  Service: Gynecology;  Laterality: N/A;   DILATATION & CURETTAGE/HYSTEROSCOPY WITH MYOSURE N/A 01/19/2022   Procedure: DILATATION & CURETTAGE/HYSTEROSCOPY WITH MYOSURE;  Surgeon: Suzi Essex, MD;  Location: [facility];  Service: Gynecology;  Laterality: N/A;   DILATION AND CURETTAGE OF UTERUS  1970s   HYSTEROSCOPY WITH D & C  1990s   NASAL FRACTURE SURGERY  age 13   OPERATIVE ULTRASOUND N/A 03/17/2021    Procedure: OPERATIVE ULTRASOUND;  Surgeon: Suzi Essex, MD;  Location: [facility];  Service: Gynecology;  Laterality: N/A;   OPERATIVE ULTRASOUND N/A 01/19/2022   Procedure: POSSIBLE OPERATIVE ULTRASOUND GUIDANCE;  Surgeon: Suzi Essex, MD;  Location: [facility];  Service: Gynecology;  Laterality: N/A;    Family History  Problem Relation Age of Onset   Stroke Mother    Heart disease Mother        pacemaker   Heart attack Father    Kidney failure Father    Heart disease Brother        secondary cardiomyopathy, PVCs, atherosclerosis of native artery, syncope & collapse - sees cardiologist   Heart disease Maternal Grandmother        pacemaker   Heart attack Maternal Grandfather    Sudden death Maternal Grandfather    Cancer Paternal Grandmother        oral - tobacco   Heart attack Paternal Grandfather    Sudden death Paternal Grandfather    Breast cancer Niece    Colon cancer Neg Hx    Ovarian cancer Neg Hx    Endometrial cancer Neg Hx    Pancreatic cancer Neg Hx    Prostate cancer Neg Hx     Social History   Socioeconomic History   Marital status: Married    Spouse name: Not on file   Number of children: Not on file   Years of education: Not on file   Highest education level: Not on file  Occupational History   Occupation: Conservation officer, nature at grocery store  Tobacco Use   Smoking status: Never   Smokeless tobacco: Never  Vaping Use   Vaping status: Never Used  Substance and Sexual Activity   Alcohol use: No   Drug use: Never   Sexual activity: Not Currently    Birth control/protection: Post-menopausal  Other Topics Concern   Not on file  Social History Narrative   Not on file   Social Drivers of Health   Financial Resource Strain: Not on file  Food Insecurity: No Food Insecurity (04/05/2024)   Hunger Vital Sign    Worried About Running Out of Food in the Last Year: Never true    Ran Out of Food in the Last Year: Never true   Transportation Needs: Not on file  Physical Activity: Not on file  Stress: Not on file  Social Connections: Unknown (05/02/2022)   Received from [facility]   Social Network    Social Network: Not on file    Current Medications:  Current Outpatient Medications:    acetaminophen  (TYLENOL ) 500 MG tablet,  Take 500-1,000 mg by mouth every 6 (six) hours as needed for mild pain, moderate pain, fever or headache., Disp: , Rfl:    calcium-vitamin D (OSCAL WITH D) 500-200 MG-UNIT per tablet, Take 2 tablets by mouth at bedtime., Disp: , Rfl:    clobetasol ointment (TEMOVATE) 0.05 %, APPLY TO AFFECTED AREA TWICE A DAY AS NEEDED, Disp: , Rfl:    clotrimazole-betamethasone (LOTRISONE) cream, Apply topically 2 (two) times daily as needed., Disp: , Rfl:    diclofenac Sodium (VOLTAREN) 1 % GEL, 4 (four) times daily as needed., Disp: , Rfl:    hydrocortisone 2.5 % cream, Apply 1 application topically daily., Disp: , Rfl:    Multiple Vitamins-Minerals (MULTIVITAMIN ADULT, MINERALS, PO), Take 1 tablet by mouth daily., Disp: , Rfl:    OVER THE COUNTER MEDICATION, Iron 65mg  with vitamin C125mg , Disp: , Rfl:    venlafaxine XR (EFFEXOR-XR) 37.5 MG 24 hr capsule, Take 37.5 mg by mouth at bedtime., Disp: , Rfl:    estradiol  (ESTRACE  VAGINAL) 0.1 MG/GM vaginal cream, Place 1 Applicatorful vaginally 3 (three) times a week. Place a small amount of estrogen cream in your vagina 3x a week at night (Patient not taking: Reported on 04/05/2024), Disp: 42.5 g, Rfl: 12  Review of Systems: + Blood in stool, vaginal bleeding, vaginal discharge Denies appetite changes, fevers, chills, fatigue, unexplained weight changes. Denies hearing loss, neck lumps or masses, mouth sores, ringing in ears or voice changes. Denies cough or wheezing.  Denies shortness of breath. Denies chest pain or palpitations. Denies leg swelling. Denies abdominal distention, pain, constipation, diarrhea, nausea, vomiting, or early  satiety. Denies pain with intercourse, dysuria, frequency, hematuria or incontinence. Denies hot flashes, pelvic pain.   Denies joint pain, back pain or muscle pain/cramps. Denies itching, rash, or wounds. Denies dizziness, headaches, numbness or seizures. Denies swollen lymph nodes or glands, denies easy bruising or bleeding. Denies anxiety, depression, confusion, or decreased concentration.  Physical Exam: BP 131/69 (BP Location: Left Arm, Patient Position: Sitting)   Pulse 64   Temp 98.1 F (36.7 C) (Oral)   Resp 16   Wt 131 lb (59.4 kg)   SpO2 100%   BMI 22.09 kg/m  General: Alert, oriented, no acute distress. HEENT: Posterior oropharynx clear, sclera anicteric. Chest: Clear to auscultation bilaterally.  No wheezes or rhonchi. Cardiovascular: Regular rate and rhythm, no murmurs. Extremities: Grossly normal range of motion.  Warm, well perfused.  No edema bilaterally. Skin: No rashes or lesions noted. Lymphatics: No cervical, supraclavicular adenopathy. GU: Deferred.  Laboratory & Radiologic Studies: None new  Assessment & Plan: Mary Juarez is a 68 y.o. 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  Discussed with patient in detail workup that she has had this far. Unfortunately, given ultrasound done at an outside clinic, I am unable to review these images.    She has had many months of postmenopausal discharge/bleeding.  She also endorses symptoms that raise the concern for possible rectovaginal fistula although I have not appreciated this previously on exams.  Given her inability to tolerate pelvic exams as well as adhesions within the vagina, discussed recommendation for exam in the OR with preoperative imaging  using a pelvic MRI.  This was scheduled today.  In the setting now of recurrent postmenopausal bleeding as well as difficulty with  her exam due to adhesions, I recommend that we proceed with definitive surgery.  She is amenable to this.  This would mean robotic hysterectomy with bilateral salpingo-oophorectomy.  I will have a phone visit with her after her MRI.  Unless MRI raises significant concern for cervical lesion that could represent cervical cancer or a rectovaginal fistula, we will proceed as scheduled with robotic total hysterectomy and BSO.  I will discuss with her risks of surgery at the time of that phone conversation.  She was given perioperative instructions with plan that these will be reviewed by phone with one of our clinic staff members prior to surgery.  She had previously seen Dr. 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  Her was essentially normal at the time of more recent workup prior to her surgery with me.  She voices concern about this again and I will have my staff reach out to him to ensure no other testing needs to be done before major surgery.  We discussed her Pap showing low-grade dysplasia, high risk HPV positive.  This was a blind Pap and given her vaginal adhesions, seems unlikely to sample the cervix itself.  She may have some low-grade vaginal dysplasia.  I will plan to inspect the vagina and cervix at the time of her upcoming surgery.  50 minutes of total time was spent for this patient encounter, including preparation, face-to-face counseling with the patient and coordination of care, and documentation of the encounter.  Wiley Hanger, MD  Division of Gynecologic Oncology  Department of Obstetrics and Gynecology  [facility]

## 2024-04-12 ENCOUNTER — Inpatient Hospital Stay: Admitting: Gynecologic Oncology

## 2024-04-12 ENCOUNTER — Inpatient Hospital Stay: Attending: Gynecologic Oncology | Admitting: Gynecologic Oncology

## 2024-04-12 ENCOUNTER — Encounter: Payer: Self-pay | Admitting: Gynecologic Oncology

## 2024-04-12 VITALS — BP 131/69 | HR 64 | Temp 98.1°F | Resp 16 | Wt 131.0 lb

## 2024-04-12 DIAGNOSIS — R140 Abdominal distension (gaseous): Secondary | ICD-10-CM | POA: Insufficient documentation

## 2024-04-12 DIAGNOSIS — N952 Postmenopausal atrophic vaginitis: Secondary | ICD-10-CM | POA: Diagnosis not present

## 2024-04-12 DIAGNOSIS — F419 Anxiety disorder, unspecified: Secondary | ICD-10-CM | POA: Insufficient documentation

## 2024-04-12 DIAGNOSIS — N950 Postmenopausal bleeding: Secondary | ICD-10-CM

## 2024-04-12 DIAGNOSIS — R634 Abnormal weight loss: Secondary | ICD-10-CM | POA: Diagnosis not present

## 2024-04-12 DIAGNOSIS — N898 Other specified noninflammatory disorders of vagina: Secondary | ICD-10-CM

## 2024-04-12 DIAGNOSIS — R6881 Early satiety: Secondary | ICD-10-CM | POA: Insufficient documentation

## 2024-04-12 DIAGNOSIS — R102 Pelvic and perineal pain: Secondary | ICD-10-CM | POA: Diagnosis not present

## 2024-04-12 NOTE — Progress Notes (Signed)
 Patient here for follow up with Dr. Orvil Bland and for a pre-operative appointment prior to her scheduled surgery on 05/10/2024. She is scheduled for a total laparoscopic hysterectomy and bilateral salpingo-oophorectomy, The surgery was discussed in detail.  See after visit summary for additional details.    Discussed post-op pain management in detail including the aspects of the enhanced recovery pathway.  Advised her that a new prescription would be sent in for Tramadol  and it is only to be used for after her upcoming surgery.  We discussed the use of tylenol  post-op and to monitor for a maximum of 4,000 mg in a 24 hour period.  Also prescribed sennakot to be used after surgery and to hold if having loose stools.  Discussed bowel prep in detail. Also discussed that Dr. Orvil Bland will review her MRI results and we will let her know if she will need to do the bowel prep.   Discussed the use of SCDs and measures to take at home to prevent DVT including frequent mobility.  Reportable signs and symptoms of DVT discussed. Post-operative instructions discussed and expectations for after surgery. Incisional care discussed as well including reportable signs and symptoms including erythema, drainage, wound separation.     30 minutes spent with the patient.  Verbalizing understanding of material discussed. No needs or concerns voiced at the end of the visit.   Advised patient to call for any needs.  Advised that her post-operative medications had been prescribed and could be picked up at any time.    This appointment is included in the global surgical bundle as pre-operative teaching and has no charge.

## 2024-04-12 NOTE — Patient Instructions (Addendum)
 Preparing for your Surgery  Plan for surgery with Dr. Orvil Bland on May 10, 2024 at [facility] OR. You will be scheduled for a robotic assisted total laparoscopic hysterectomy (removal of the uterus and cervix), bilateral salpingo-oophorectomy (removal of both ovaries and fallopian tubes).    Pre-operative Testing -You will receive a phone call from presurgical testing at [facility] to arrange for a pre-operative appointment and lab work.  -Bring your insurance card, copy of an advanced directive if applicable, medication list  -At that visit, you will be asked to sign a consent for a possible blood transfusion in case a transfusion becomes necessary during surgery.  The need for a blood transfusion is rare but having consent is a necessary part of your care.     -You should not be taking blood thinners or aspirin at least ten days prior to surgery unless instructed by your surgeon.  -Do not take supplements such as fish oil (omega 3), red yeast rice, turmeric before your surgery. STOP TAKING AT LEAST 10 DAYS BEFORE SURGERY. You want to avoid medications with aspirin in them including headache powders such as BC or Goody's), Excedrin migraine.  Day Before Surgery at Home -You will be asked to take in a light diet the day before surgery. You will be advised you can have clear liquids up until 3 hours before your surgery.    Eat a light diet the day before surgery.  Examples including soups, broths, toast, yogurt, mashed potatoes.  AVOID GAS PRODUCING FOODS AND BEVERAGES. Things to avoid include carbonated beverages (fizzy beverages, sodas), raw fruits and raw vegetables (uncooked), or beans.   If your bowels are filled with gas, your surgeon will have difficulty visualizing your pelvic organs which increases your surgical risks.  Your role in recovery Your role is to become active as soon as directed by your doctor, while still giving yourself time to heal.  Rest when you feel  tired. You will be asked to do the following in order to speed your recovery:  - Cough and breathe deeply. This helps to clear and expand your lungs and can prevent pneumonia after surgery.  - STAY ACTIVE WHEN YOU GET HOME. Do mild physical activity. Walking or moving your legs help your circulation and body functions return to normal. Do not try to get up or walk alone the first time after surgery.   -If you develop swelling on one leg or the other, pain in the back of your leg, redness/warmth in one of your legs, please call the office or go to the Emergency Room to have a doppler to rule out a blood clot. For shortness of breath, chest pain-seek care in the Emergency Room as soon as possible. - Actively manage your pain. Managing your pain lets you move in comfort. We will ask you to rate your pain on a scale of zero to 10. It is your responsibility to tell your doctor or nurse where and how much you hurt so your pain can be treated.  Special Considerations -If you are diabetic, you may be placed on insulin after surgery to have closer control over your blood sugars to promote healing and recovery.  This does not mean that you will be discharged on insulin.  If applicable, your oral antidiabetics will be resumed when you are tolerating a solid diet.  -Your final pathology results from surgery should be available around one week after surgery and the results will be relayed to you when  available.  -FMLA forms can be faxed to [phone] and please allow 5-7 business days for completion.  Pain Management After Surgery -You will be prescribed your pain medication and bowel regimen medications before surgery so that you can have these available when you are discharged from the hospital. The pain medication is for use ONLY AFTER surgery and a new prescription will not be given.   -Make sure that you have Tylenol  and Ibuprofen IF YOU ARE ABLE TO TAKE THESE MEDICATIONS at home to use on a regular basis  after surgery for pain control. We recommend alternating the medications every hour to six hours since they work differently and are processed in the body differently for pain relief.  -Review the attached handout on narcotic use and their risks and side effects.   Bowel Regimen -You will be prescribed Sennakot-S to take nightly to prevent constipation especially if you are taking the narcotic pain medication intermittently.  It is important to prevent constipation and drink adequate amounts of liquids. You can stop taking this medication when you are not taking pain medication and you are back on your normal bowel routine.  Risks of Surgery Risks of surgery are low but include bleeding, infection, damage to surrounding structures, re-operation, blood clots, and very rarely death.   Blood Transfusion Information (For the consent to be signed before surgery)  We will be checking your blood type before surgery so in case of emergencies, we will know what type of blood you would need.                                            WHAT IS A BLOOD TRANSFUSION?  A transfusion is the replacement of blood or some of its parts. Blood is made up of multiple cells which provide different functions. Red blood cells carry oxygen and are used for blood loss replacement. White blood cells fight against infection. Platelets control bleeding. Plasma helps clot blood. Other blood products are available for specialized needs, such as hemophilia or other clotting disorders. BEFORE THE TRANSFUSION  Who gives blood for transfusions?  You may be able to donate blood to be used at a later date on yourself (autologous donation). Relatives can be asked to donate blood. This is generally not any safer than if you have received blood from a stranger. The same precautions are taken to ensure safety when a relative's blood is donated. Healthy volunteers who are fully evaluated to make sure their blood is safe. This is  blood bank blood. Transfusion therapy is the safest it has ever been in the practice of medicine. Before blood is taken from a donor, a complete history is taken to make sure that person has no history of diseases nor engages in risky social behavior (examples are intravenous drug use or sexual activity with multiple partners). The donor's travel history is screened to minimize risk of transmitting infections, such as malaria. The donated blood is tested for signs of infectious diseases, such as HIV and hepatitis. The blood is then tested to be sure it is compatible with you in order to minimize the chance of a transfusion reaction. If you or a relative donates blood, this is often done in anticipation of surgery and is not appropriate for emergency situations. It takes many days to process the donated blood. RISKS AND COMPLICATIONS Although transfusion therapy is very safe and  saves many lives, the main dangers of transfusion include:  Getting an infectious disease. Developing a transfusion reaction. This is an allergic reaction to something in the blood you were given. Every precaution is taken to prevent this. The decision to have a blood transfusion has been considered carefully by your caregiver before blood is given. Blood is not given unless the benefits outweigh the risks.  AFTER SURGERY INSTRUCTIONS  Return to work: 4-6 weeks if applicable  Activity: 1. Be up and out of the bed during the day.  Take a nap if needed.  You may walk up steps but be careful and use the hand rail.  Stair climbing will tire you more than you think, you may need to stop part way and rest.   2. No lifting or straining for 6 weeks over 10 pounds. No pushing, pulling, straining for 6 weeks.  3. No driving for 3-08 days when the following criteria have been met: Do not drive if you are taking narcotic pain medicine and make sure that your reaction time has returned.   4. You can shower as soon as the next day after  surgery. Shower daily.  Use your regular soap and water (not directly on the incision) and pat your incision(s) dry afterwards; don't rub.  No tub baths or submerging your body in water until cleared by your surgeon. If you have the soap that was given to you by pre-surgical testing that was used before surgery, you do not need to use it afterwards because this can irritate your incisions.   5. No sexual activity and nothing in the vagina for 12 weeks.  6. You may experience a small amount of clear drainage from your incisions, which is normal.  If the drainage persists, increases, or changes color please call the office.  7. Do not use creams, lotions, or ointments such as neosporin on your incisions after surgery until advised by your surgeon because they can cause removal of the dermabond glue on your incisions.    8. You may experience vaginal spotting after surgery or when the stitches at the top of the vagina begin to dissolve.  The spotting is normal but if you experience heavy bleeding, call our office.  9. Take Tylenol  or ibuprofen first for pain if you are able to take these medications and only use narcotic pain medication for severe pain not relieved by the Tylenol  or Ibuprofen.  Monitor your Tylenol  intake to a max of 4,000 mg in a 24 hour period. You can alternate these medications after surgery.  Diet: 1. Low sodium Heart Healthy Diet is recommended but you are cleared to resume your normal (before surgery) diet after your procedure.  2. It is safe to use a laxative, such as Miralax or Colace, if you have difficulty moving your bowels before surgery. You have been prescribed Sennakot-S to take at bedtime every evening after surgery to keep bowel movements regular and to prevent constipation.    Wound Care: 1. Keep clean and dry.  Shower daily.  Reasons to call the Doctor: Fever - Oral temperature greater than 100.4 degrees Fahrenheit Foul-smelling vaginal discharge Difficulty  urinating Nausea and vomiting Increased pain at the site of the incision that is unrelieved with pain medicine. Difficulty breathing with or without chest pain New calf pain especially if only on one side Sudden, continuing increased vaginal bleeding with or without clots.   Contacts: For questions or concerns you should contact:  Dr. Wiley Hanger at [phone]  Vira Grieves, NP at [phone]  After Hours: call [phone] and have the GYN Oncologist paged/contacted (after 5 pm or on the weekends). You will speak with an after hours RN and let he or she know you have had surgery.  Messages sent via mychart are for non-urgent matters and are not responded to after hours so for urgent needs, please call the after hours number.  GYN Oncology Bowel Preparation for surgery  We will know after your MRI if you will need to have the bowel prep.   There are two important steps to take to prepare for your surgery:   1. Cleansing of your colon: all the stool is washed out of your colon.   2. Take antibiotics: taken by mouth to help prevent infection.   INSTRUCTIONS:   As Soon As Possible (at least 2-3 days BEFORE your surgery)   Please buy the following (5) items from a pharmacy:   The first item is an antibiotic. The first four items will be sent in as prescriptions and you can get the Gatorade or powerade at the drug store or grocery store.    1. Flagyl pills - 1 gram, 3 doses (antibiotic)   The next 2 items are laxatives and work to cleanse your colon.   3. 2 Dulcolax 5 mg tablets   4. 238 grams of Miralax (8.3 ounce bottle)   5. 64 oz of Gatorade or Powerade (not red)   (NOTE: If you are allergic to any of these medications, the prep will NOT be prescribed for your. Please let your physician know of any allergies.)   The Day Before Your Surgery   1. Do NOT eat any solid food. Do NOT drink unfiltered juices such as apple cider. Drink only clear liquids such as  juice, black coffee, tea, sports drinks, soda pop, Jell-O, water. Please refer to handout from the pre-care suite for more details).   2. Starting at 9:00 a.m.: Take 2 Dulcolax tablets with 2 glasses of clear liquid.   3. 11:00 a.m.: Mix whole bottle of Miralax in 64 oz of Gatorade and drink one 8 oz glass every 15 minutes until gone.   4. When you have finished the Miralax, drink at least 4 glasses of clear liquids of your choice. You will start experiencing diarrhea anywhere between 30 minutes to 3 hours after completing the Miralax. Keep drinking plenty of clear liquids throughout the day. This will keep you from getting dehydrated from the diarrhea.   5. Take your antibiotics by mouth at these times after you complete the Miralax:   - At 2 p.m.: Take Flagyl (1 gram, total of two tablets)   - At 3 p.m.: Take Flagyl 2 tablets (1000 mg total or 1 gm)   - At 10 p.m.: Take Flagyl 2 tablets (1000 mg total or 1 gm)   The Day of Your Surgery   On the morning of your surgery, only take the medicines that the pre-care suite told you were okay to take. Take them only with a sip of water.   Special Instructions:   - Please be sure you make your surgeon aware if you have diabetes. Your diabetic medications may need to be adjusted.   - If you feel dizzy or have severe nausea, vomiting or abdominal pain, or if you cannot finish drinking the bowel prep, please call the Gynecologic Oncology clinic at [phone] or your surgeon.   - If you have any life-threatening symptoms including wheezing, chest tightness,  fever, swelling of your face, lips, tongue or throat, call [phone]- right away.   Questions?   Please call if you have questions or concerns:   - Weekdays 8:00 a.m. to 5:00 pm: Call the office at [phone]   - After hours and on weekends and holidays, call the paging operator at [phone] and ask for the GYN ONC on call to be paged.

## 2024-04-13 ENCOUNTER — Inpatient Hospital Stay: Admitting: Licensed Clinical Social Worker

## 2024-04-13 DIAGNOSIS — N840 Polyp of corpus uteri: Secondary | ICD-10-CM

## 2024-04-13 NOTE — Progress Notes (Signed)
 [facility] CSW Progress Note  Visual merchandiser met with patient to complete advanced directives.  Pt states she believes she may already have advanced directives on file.  CSW checked and pt does in fact have forms on file.  Pt declined updating her advanced directives at this time.      Quenton Bruns, LCSW Clinical Social Worker [facility]

## 2024-04-13 NOTE — Progress Notes (Signed)
 [facility] Clinical Social Work  Clinical Social Work was referred by Child psychotherapist S. Government social research officer for assessment of psychosocial needs.  Clinical Social Worker contacted patient by phone to offer support and assess for needs.    Pt is anxious about upcoming surgery and having someone availble to be with her the day after surgery. Per pt, her husband has to work and that Friday is a blcoked day where no one can take off. She has limited family and friends and does not think anyone else can assist. Discussed seeing if her husband can take FMLA. If not, CSW provided a list of in-home aide agencies if pt needs to hire an aide. Pt is open to this but doesn't know if husband will want someone in the home, so she may stay at a hotel. CSW confirmed with medical team on pt's behalf that she does need someone with her for 24 hrs post-anesthesia.  Pt also inquired if counseling is available post-surgery for her. CSW informed pt that services here are for people with cancer diagnosis, but options can be provided for counselors in the community if desired.   Pt appreciated the information and expressed understanding of the above.     Christo Hain E Shakayla Hickox, LCSW  Clinical Social Worker [facility]

## 2024-04-18 ENCOUNTER — Encounter: Payer: Self-pay | Admitting: Gynecologic Oncology

## 2024-04-18 ENCOUNTER — Ambulatory Visit (HOSPITAL_COMMUNITY)
Admission: RE | Admit: 2024-04-18 | Discharge: 2024-04-18 | Disposition: A | Discharge status: Home / Self Care | Source: Ambulatory Visit | Attending: Gynecologic Oncology | Admitting: Gynecologic Oncology

## 2024-04-18 DIAGNOSIS — N950 Postmenopausal bleeding: Secondary | ICD-10-CM | POA: Diagnosis present

## 2024-04-18 MED ORDER — GADOBUTROL 1 MMOL/ML IV SOLN
6.0000 mL | Freq: Once | INTRAVENOUS | Status: AC | PRN
  Administered 2024-04-18: 6 mL via INTRAVENOUS

## 2024-04-19 ENCOUNTER — Encounter: Payer: Self-pay | Admitting: Nurse Practitioner

## 2024-04-25 ENCOUNTER — Inpatient Hospital Stay: Attending: Gynecologic Oncology | Admitting: Gynecologic Oncology

## 2024-04-25 ENCOUNTER — Encounter: Payer: Self-pay | Admitting: Gynecologic Oncology

## 2024-04-25 DIAGNOSIS — N952 Postmenopausal atrophic vaginitis: Secondary | ICD-10-CM | POA: Diagnosis not present

## 2024-04-25 DIAGNOSIS — R87622 Low grade squamous intraepithelial lesion on cytologic smear of vagina (LGSIL): Secondary | ICD-10-CM | POA: Diagnosis not present

## 2024-04-25 DIAGNOSIS — N950 Postmenopausal bleeding: Secondary | ICD-10-CM

## 2024-04-25 NOTE — Progress Notes (Signed)
 Gynecologic Oncology Telehealth Note: Gyn-Onc  I connected with Mary Juarez on 04/25/24 at  6:00 PM EDT by telephone and verified that I am speaking with the correct person using two identifiers.  I discussed the limitations, risks, security and privacy concerns of performing an evaluation and management service by telemedicine and the availability of in-person appointments. I also discussed with the patient that there may be a patient responsible charge related to this service. The patient expressed understanding and agreed to proceed.  Other persons participating in the visit and their role in the encounter: none.  Patient's location: home Provider's location: [place]  Reason for Visit: treatment planning  Treatment History: Patient endorses menopause at the age of 41-43.  She denies any postmenopausal bleeding until December 10 at which time she had sharp left-sided pelvic pain.  She then had some older appearing spotting.  She called and saw her primary care provider that day.  She was referred to gynecology and ultimately was able to schedule an appointment.  She had work-up including pelvic exam, Pap test, ultrasound, and the most recently attempt at endometrial biopsy.  Her pelvic ultrasound, performed at [facility] on 2/15, showed a uterus measuring 5 x 2.5 x 2.7 cm with an endometrial lining of 4.6 mm.  Ovaries were normal in appearance.  Myometrium noted to be normal.  Pap test showed negative for intraepithelial lesion, HPV negative.  Endometrial biopsy on 2/24 showed rare benign endometrial fragments in the specimen consisting mainly of mucus and endocervical glands.  Given her discomfort and intolerance of exams, the patient required premedication with lorazepam  and Tylenol  prior to both pelvic exams and her procedure.  Her endometrial biopsy had to be aborted early due to patient discomfort.  Only a small sample was able to be obtained.   Prior to the biopsy, she notes having what she  describes as fluctuations in her hormone levels the past several months.  This was especially bothersome in January and she notes moodiness during this time.  With regard to her bleeding, she would have a spot of older appearing blood on the tissue paper when she wiped.  She also noted some bright red rectal bleeding that has been attributed to hemorrhoids.  She saw a gastroenterologist for this recently.  Since her attempted endometrial biopsy, she had a week of bright red bleeding, increased in quantity from what she was previously having.  Since Monday of this week, she denies any vaginal bleeding.   Overall, patient notes that her appetite has been decreased for a number of weeks.  She is having to make herself eat.  She notes having early satiety and occasional bloating.  She reports about a 40 pound weight loss in the last 8-12 months.  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  She denies any attempt at weight loss.  She notes regular bowel movements which is aided by her high consumption of vegetables.  She has become more regular in the last several months.  She denies any changes to urination, with increased frequency especially at night which she has had for decades.   She describes a significant GYN history with very painful and heavy periods from a young age.  She saw multiple providers for this and ultimately no cause was found.  At points previously, she would run to help deal with and treat her pain.  She had laparoscopic surgery  a number of years ago that she was thought perhaps to have endometriosis.  She was told after the surgery that she had extensive intra-abdominal scarring but the cause of this was unknown.  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  She had a previous attempted a D&C but this was abandoned due to inability to dilate the cervix and enter the uterine cavity.  She was placed  on Megace subsequently for several months.  She also had a hysterosalpingogram at some point to check the patency of her fallopian tubes.  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   Patient underwent hysteroscopy with MyoSure sampling on 03/17/21 in the setting of postmenopausal bleeding.  Pathology revealed benign endometrial polyp, atrophic endometrium, no hyperplasia or malignancy.   She presented again in late 2022 with 3 day episode of heavy vaginal bleeding. Pelvic ultrasound showed thickened endometrium.   01/19/22: Lysis of vagina adhesions, EUA, dilation of cervix under ultrasound guidance, hysteroscopy and endometrial and endocervical sampling with the Myosure Reach. Pathology revealed benign inactive endometrium and benign cervical glandular mucosa. No dysplasia, hyperplasia, or malignancy.  Patient presented to see her OB/GYN in February and endorsed postmenopausal bleeding for 8 months.  She described this as brownish-yellow spotting with red discharge with associated menstrual-like cramps.   Pelvic ultrasound 02/15/24 to evaluated postmenopausal bleeding. Uterus 6.3 x 2.5 x 2.8 cm, endometrium 2.1 mm. LUS difficult to evaluate but appeared enlarge and lobular measuring 3 x 2.3 x 2.3 cm. Cervix not clearly identified. Neither ovary visualized, no adnexal masses.    Pap test was performed on 02/17/24: LSIL, HR HPV detected. This was performed due to lobular appearance of lower uterine segment on ultrasound. Cervix not visualized on exam so blind pap performed.    Today, the patient reports having vaginal spotting or discharge for quite a while before she saw her OB/GYN.  She describes this discharge is unpredictable, sometimes brown, sometimes red, sometimes bright red bleeding.  At times it will be larger quantities which she describes as a "glob" that she sees on her depends.  Sometimes she passes "brown junk".  After she  urinates, and sometimes after she has a bowel movement, she sees brown material when she wipes that Edmund Gouge looks like what she would expect after wiping with a bowel movement.  She has not smelled this.  Bowel movements have been more regular than previously.  She endorses a bowel movement most days, majority of the time these are soft, sometimes hard pellets.  At the end of her bowel movements, there is sometimes bright red blood.  She sometimes has cramping with bowel movements, or before she experiences bleeding.   Colonoscopy was in 2023, virtual colonoscopy, no polyps or strictures.  04/18/24: MRI Normal postmenopausal appearance of uterus and ovaries. No evidence of pelvic mass or other significant abnormality.  Interval History: Doing well.  No significant change.  Having less bleeding, still unsure if this is bleeding or discharge/fecal material.  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   Complication of anesthesia    per pt was very hypotensive w/ d&c hysteroscopy in 1990s in [place],  no issue the next surgery early 2000s don @[facility]    Depression    Frequency of urination    History of chest pain  evaulation by cardiologist,  dr Maximo Spar,  ekg normal, ETT 06-25-2015 normal (in epic),  atypical chest pain no further work-up   History of concussion    per pt has had few times , last time at age 53,  no residual   PMB (postmenopausal bleeding)    Thickened endometrium    Wears glasses     Past Surgical History:  Procedure Laterality Date   BREAST BIOPSY Right 10/27/2023   US  RT BREAST BX W LOC DEV 1ST LESION IMG BX SPEC US  GUIDE 10/27/2023 [facility]   CERVICAL POLYPECTOMY  early 2000s   dr Gena Kemp @[facility]    COLONOSCOPY  2012 approx.   DIAGNOSTIC LAPAROSCOPY  1970s   for painful menses   DILATATION & CURETTAGE/HYSTEROSCOPY WITH MYOSURE N/A 03/17/2021   Procedure: DILATATION  & CURETTAGE/HYSTEROSCOPY WITH MYOSURE;  Surgeon: Suzi Essex, MD;  Location: [facility];  Service: Gynecology;  Laterality: N/A;   DILATATION & CURETTAGE/HYSTEROSCOPY WITH MYOSURE N/A 01/19/2022   Procedure: DILATATION & CURETTAGE/HYSTEROSCOPY WITH MYOSURE;  Surgeon: Suzi Essex, MD;  Location: [facility];  Service: Gynecology;  Laterality: N/A;   DILATION AND CURETTAGE OF UTERUS  1970s   HYSTEROSCOPY WITH D & C  1990s   NASAL FRACTURE SURGERY  age 38   OPERATIVE ULTRASOUND N/A 03/17/2021   Procedure: OPERATIVE ULTRASOUND;  Surgeon: Suzi Essex, MD;  Location: [facility];  Service: Gynecology;  Laterality: N/A;   OPERATIVE ULTRASOUND N/A 01/19/2022   Procedure: POSSIBLE OPERATIVE ULTRASOUND GUIDANCE;  Surgeon: Suzi Essex, MD;  Location: [facility];  Service: Gynecology;  Laterality: N/A;    Family History  Problem Relation Age of Onset   Stroke Mother    Heart disease Mother        pacemaker   Heart attack Father    Kidney failure Father    Heart disease Brother        secondary cardiomyopathy, PVCs, atherosclerosis of native artery, syncope & collapse - sees cardiologist   Heart disease Maternal Grandmother        pacemaker   Heart attack Maternal Grandfather    Sudden death Maternal Grandfather    Cancer Paternal Grandmother        oral - tobacco   Heart attack Paternal Grandfather    Sudden death Paternal Grandfather    Breast cancer Niece    Colon cancer Neg Hx    Ovarian cancer Neg Hx    Endometrial cancer Neg Hx    Pancreatic cancer Neg Hx    Prostate cancer Neg Hx     Social History   Socioeconomic History   Marital status: Married    Spouse name: Not on file   Number of children: Not on file   Years of education: Not on file   Highest education level: Not on file  Occupational History   Occupation: Conservation officer, nature at grocery store  Tobacco Use   Smoking status: Never    Smokeless tobacco: Never  Vaping Use   Vaping status: Never Used  Substance and Sexual Activity   Alcohol use: No   Drug use: Never   Sexual activity: Not Currently    Birth control/protection: Post-menopausal  Other Topics Concern   Not on file  Social History Narrative   Not on file   Social Drivers of Health   Financial Resource Strain: Not on file  Food Insecurity: No Food Insecurity (04/05/2024)   Hunger Vital Sign    Worried About  Running Out of Food in the Last Year: Never true    Ran Out of Food in the Last Year: Never true  Transportation Needs: Not on file  Physical Activity: Not on file  Stress: Not on file  Social Connections: Unknown (05/02/2022)   [facility] from [facility]   Social Network    Social Network: Not on file    Current Medications:  Current Outpatient Medications:    acetaminophen  (TYLENOL ) 500 MG tablet, Take 500-1,000 mg by mouth every 6 (six) hours as needed for mild pain, moderate pain, fever or headache., Disp: , Rfl:    aspirin-acetaminophen -caffeine (EXCEDRIN MIGRAINE) 250-250-65 MG tablet, Take 1 tablet by mouth daily as needed for headache., Disp: , Rfl:    B Complex-C (B-COMPLEX WITH VITAMIN C) tablet, Take 1 tablet by mouth daily., Disp: , Rfl:    diclofenac Sodium (VOLTAREN) 1 % GEL, Apply 1 Application topically 4 (four) times daily as needed (pain)., Disp: , Rfl:    hydrocortisone 2.5 % cream, 1 application  daily. Apply to rectum, Disp: , Rfl:    Iron-Vitamin C 65-125 MG TABS, Take 1 tablet by mouth 4 (four) times a week., Disp: , Rfl:    Multiple Vitamins-Minerals (MULTIVITAMIN ADULT, MINERALS, PO), Take 1 tablet by mouth daily. [facility] Women's Multivitamin, Disp: , Rfl:    Naphazoline-Pheniramine (OPCON-A) 0.027-0.315 % SOLN, Place 1 drop into both eyes as needed (eye allergies)., Disp: , Rfl:    venlafaxine XR (EFFEXOR-XR) 37.5 MG 24 hr capsule, Take 37.5 mg by mouth at bedtime., Disp: , Rfl:    zinc gluconate 50 MG  tablet, Take 50 mg by mouth 4 (four) times a week., Disp: , Rfl:   Review of Symptoms: Pertinent positives as per HPI.  Physical Exam: Deferred given limitations of phone visit.  Laboratory & Radiologic Studies: See above  Assessment & Plan: Mary Juarez is a 69 y.o. 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   Discussed recent MRI results.  Overall very reassuring.  No pelvic pathology.  There appears to be a fat plane between the rectum and the uterus.  Cervical mass noted.  We reviewed extensively again her workup to date with more recent postmenopausal bleeding.  She is feeling somewhat overwhelmed and uncomfortable with the idea of hysterectomy.  She was interested in hearing whether we could avoid doing a hysterectomy at this time.  Reviewed again the separate issues for which she was sent back to my clinic including vaginal Pap showing low-grade dysplasia, high risk HPV.  For this, she will need colposcopic examination, possible vaginal biopsies.  We discussed that low-grade dysplasia is followed closely without treatment given the low risk of progression to cancer.  With regard to the cervical mass, no mass appreciated on pelvic MRI.  Will confirm this at the time of surgery.  Lastly, we discussed her postmenopausal bleeding.  This still needs additional workup including pelvic exam, lysis of upper vaginal adhesions, possible cervical or upper vaginal biopsy, and endometrial sampling.  Given her strong desire to avoid definitive surgery at this time, I offered that we could proceed with EUA, lysis of vaginal adhesions, hysteroscopy, endometrial sampling (all with ultrasound guidance if needed), vaginoscopy, possible vaginal  biopsies.  I discussed the risk that she could have recurrent postmenopausal bleeding  that would ultimately require definitive treatment in the future.  Voices understanding of this risk.  I will reach out to my office to have her procedure changed.  I discussed the assessment and treatment plan with the patient. The patient was provided with an opportunity to ask questions and all were answered. The patient agreed with the plan and demonstrated an understanding of the instructions.   The patient was advised to call back or see an in-person evaluation if the symptoms worsen or if the condition fails to improve as anticipated.   28 minutes of total time was spent for this patient encounter, including preparation, phone counseling with the patient and coordination of care, and documentation of the encounter.   Wiley Hanger, MD  Division of Gynecologic Oncology  Department of Obstetrics and Gynecology  [facility]

## 2024-04-25 NOTE — H&P (View-Only) (Signed)
 Gynecologic Oncology Telehealth Note: Gyn-Onc  I connected with Mary Juarez on 04/25/24 at  6:00 PM EDT by telephone and verified that I am speaking with the correct person using two identifiers.  I discussed the limitations, risks, security and privacy concerns of performing an evaluation and management service by telemedicine and the availability of in-person appointments. I also discussed with the patient that there may be a patient responsible charge related to this service. The patient expressed understanding and agreed to proceed.  Other persons participating in the visit and their role in the encounter: none.  Patient's location: home Provider's location: [place]  Reason for Visit: treatment planning  Treatment History: Patient endorses menopause at the age of 41-43.  She denies any postmenopausal bleeding until December 10 at which time she had sharp left-sided pelvic pain.  She then had some older appearing spotting.  She called and saw her primary care provider that day.  She was referred to gynecology and ultimately was able to schedule an appointment.  She had work-up including pelvic exam, Pap test, ultrasound, and the most recently attempt at endometrial biopsy.  Her pelvic ultrasound, performed at [facility] on 2/15, showed a uterus measuring 5 x 2.5 x 2.7 cm with an endometrial lining of 4.6 mm.  Ovaries were normal in appearance.  Myometrium noted to be normal.  Pap test showed negative for intraepithelial lesion, HPV negative.  Endometrial biopsy on 2/24 showed rare benign endometrial fragments in the specimen consisting mainly of mucus and endocervical glands.  Given her discomfort and intolerance of exams, the patient required premedication with lorazepam  and Tylenol  prior to both pelvic exams and her procedure.  Her endometrial biopsy had to be aborted early due to patient discomfort.  Only a small sample was able to be obtained.   Prior to the biopsy, she notes having what she  describes as fluctuations in her hormone levels the past several months.  This was especially bothersome in January and she notes moodiness during this time.  With regard to her bleeding, she would have a spot of older appearing blood on the tissue paper when she wiped.  She also noted some bright red rectal bleeding that has been attributed to hemorrhoids.  She saw a gastroenterologist for this recently.  Since her attempted endometrial biopsy, she had a week of bright red bleeding, increased in quantity from what she was previously having.  Since Monday of this week, she denies any vaginal bleeding.   Overall, patient notes that her appetite has been decreased for a number of weeks.  She is having to make herself eat.  She notes having early satiety and occasional bloating.  She reports about a 40 pound weight loss in the last 8-12 months.  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  She denies any attempt at weight loss.  She notes regular bowel movements which is aided by her high consumption of vegetables.  She has become more regular in the last several months.  She denies any changes to urination, with increased frequency especially at night which she has had for decades.   She describes a significant GYN history with very painful and heavy periods from a young age.  She saw multiple providers for this and ultimately no cause was found.  At points previously, she would run to help deal with and treat her pain.  She had laparoscopic surgery  a number of years ago that she was thought perhaps to have endometriosis.  She was told after the surgery that she had extensive intra-abdominal scarring but the cause of this was unknown.  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  She had a previous attempted a D&C but this was abandoned due to inability to dilate the cervix and enter the uterine cavity.  She was placed  on Megace subsequently for several months.  She also had a hysterosalpingogram at some point to check the patency of her fallopian tubes.  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   Patient underwent hysteroscopy with MyoSure sampling on 03/17/21 in the setting of postmenopausal bleeding.  Pathology revealed benign endometrial polyp, atrophic endometrium, no hyperplasia or malignancy.   She presented again in late 2022 with 3 day episode of heavy vaginal bleeding. Pelvic ultrasound showed thickened endometrium.   01/19/22: Lysis of vagina adhesions, EUA, dilation of cervix under ultrasound guidance, hysteroscopy and endometrial and endocervical sampling with the Myosure Reach. Pathology revealed benign inactive endometrium and benign cervical glandular mucosa. No dysplasia, hyperplasia, or malignancy.  Patient presented to see her OB/GYN in February and endorsed postmenopausal bleeding for 8 months.  She described this as brownish-yellow spotting with red discharge with associated menstrual-like cramps.   Pelvic ultrasound 02/15/24 to evaluated postmenopausal bleeding. Uterus 6.3 x 2.5 x 2.8 cm, endometrium 2.1 mm. LUS difficult to evaluate but appeared enlarge and lobular measuring 3 x 2.3 x 2.3 cm. Cervix not clearly identified. Neither ovary visualized, no adnexal masses.    Pap test was performed on 02/17/24: LSIL, HR HPV detected. This was performed due to lobular appearance of lower uterine segment on ultrasound. Cervix not visualized on exam so blind pap performed.    Today, the patient reports having vaginal spotting or discharge for quite a while before she saw her OB/GYN.  She describes this discharge is unpredictable, sometimes brown, sometimes red, sometimes bright red bleeding.  At times it will be larger quantities which she describes as a "glob" that she sees on her depends.  Sometimes she passes "brown junk".  After she  urinates, and sometimes after she has a bowel movement, she sees brown material when she wipes that Edmund Gouge looks like what she would expect after wiping with a bowel movement.  She has not smelled this.  Bowel movements have been more regular than previously.  She endorses a bowel movement most days, majority of the time these are soft, sometimes hard pellets.  At the end of her bowel movements, there is sometimes bright red blood.  She sometimes has cramping with bowel movements, or before she experiences bleeding.   Colonoscopy was in 2023, virtual colonoscopy, no polyps or strictures.  04/18/24: MRI Normal postmenopausal appearance of uterus and ovaries. No evidence of pelvic mass or other significant abnormality.  Interval History: Doing well.  No significant change.  Having less bleeding, still unsure if this is bleeding or discharge/fecal material.  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   Complication of anesthesia    per pt was very hypotensive w/ d&c hysteroscopy in 1990s in [place],  no issue the next surgery early 2000s don @[facility]    Depression    Frequency of urination    History of chest pain  evaulation by cardiologist,  dr Maximo Spar,  ekg normal, ETT 06-25-2015 normal (in epic),  atypical chest pain no further work-up   History of concussion    per pt has had few times , last time at age 53,  no residual   PMB (postmenopausal bleeding)    Thickened endometrium    Wears glasses     Past Surgical History:  Procedure Laterality Date   BREAST BIOPSY Right 10/27/2023   US  RT BREAST BX W LOC DEV 1ST LESION IMG BX SPEC US  GUIDE 10/27/2023 [facility]   CERVICAL POLYPECTOMY  early 2000s   dr Gena Kemp @[facility]    COLONOSCOPY  2012 approx.   DIAGNOSTIC LAPAROSCOPY  1970s   for painful menses   DILATATION & CURETTAGE/HYSTEROSCOPY WITH MYOSURE N/A 03/17/2021   Procedure: DILATATION  & CURETTAGE/HYSTEROSCOPY WITH MYOSURE;  Surgeon: Suzi Essex, MD;  Location[facility];  Service: Gynecology;  Laterality: N/A;   DILATATION & CURETTAGE/HYSTEROSCOPY WITH MYOSURE N/A 01/19/2022   Procedure: DILATATION & CURETTAGE/HYSTEROSCOPY WITH MYOSURE;  Surgeon: Suzi Essex, MD;  Location: [facility];  Service: Gynecology;  Laterality: N/A;   DILATION AND CURETTAGE OF UTERUS  1970s   HYSTEROSCOPY WITH D & C  1990s   NASAL FRACTURE SURGERY  age 38   OPERATIVE ULTRASOUND N/A 03/17/2021   Procedure: OPERATIVE ULTRASOUND;  Surgeon: Suzi Essex, MD;  Location: [facility];  Service: Gynecology;  Laterality: N/A;   OPERATIVE ULTRASOUND N/A 01/19/2022   Procedure: POSSIBLE OPERATIVE ULTRASOUND GUIDANCE;  Surgeon: Suzi Essex, MD;  Location: [facility];  Service: Gynecology;  Laterality: N/A;    Family History  Problem Relation Age of Onset   Stroke Mother    Heart disease Mother        pacemaker   Heart attack Father    Kidney failure Father    Heart disease Brother        secondary cardiomyopathy, PVCs, atherosclerosis of native artery, syncope & collapse - sees cardiologist   Heart disease Maternal Grandmother        pacemaker   Heart attack Maternal Grandfather    Sudden death Maternal Grandfather    Cancer Paternal Grandmother        oral - tobacco   Heart attack Paternal Grandfather    Sudden death Paternal Grandfather    Breast cancer Niece    Colon cancer Neg Hx    Ovarian cancer Neg Hx    Endometrial cancer Neg Hx    Pancreatic cancer Neg Hx    Prostate cancer Neg Hx     Social History   Socioeconomic History   Marital status: Married    Spouse name: Not on file   Number of children: Not on file   Years of education: Not on file   Highest education level: Not on file  Occupational History   Occupation: Conservation officer, nature at grocery store  Tobacco Use   Smoking status: Never    Smokeless tobacco: Never  Vaping Use   Vaping status: Never Used  Substance and Sexual Activity   Alcohol use: No   Drug use: Never   Sexual activity: Not Currently    Birth control/protection: Post-menopausal  Other Topics Concern   Not on file  Social History Narrative   Not on file   Social Drivers of Health   Financial Resource Strain: Not on file  Food Insecurity: No Food Insecurity (04/05/2024)   Hunger Vital Sign    Worried About  Running Out of Food in the Last Year: Never true    Ran Out of Food in the Last Year: Never true  Transportation Needs: Not on file  Physical Activity: Not on file  Stress: Not on file  Social Connections: Unknown (05/02/2022)   Received from [facility]   Social Network    Social Network: Not on file    Current Medications:  Current Outpatient Medications:    acetaminophen  (TYLENOL ) 500 MG tablet, Take 500-1,000 mg by mouth every 6 (six) hours as needed for mild pain, moderate pain, fever or headache., Disp: , Rfl:    aspirin-acetaminophen -caffeine (EXCEDRIN MIGRAINE) 250-250-65 MG tablet, Take 1 tablet by mouth daily as needed for headache., Disp: , Rfl:    B Complex-C (B-COMPLEX WITH VITAMIN C) tablet, Take 1 tablet by mouth daily., Disp: , Rfl:    diclofenac Sodium (VOLTAREN) 1 % GEL, Apply 1 Application topically 4 (four) times daily as needed (pain)., Disp: , Rfl:    hydrocortisone 2.5 % cream, 1 application  daily. Apply to rectum, Disp: , Rfl:    Iron-Vitamin C 65-125 MG TABS, Take 1 tablet by mouth 4 (four) times a week., Disp: , Rfl:    Multiple Vitamins-Minerals (MULTIVITAMIN ADULT, MINERALS, PO), Take 1 tablet by mouth daily. [employer] Women's Multivitamin, Disp: , Rfl:    Naphazoline-Pheniramine (OPCON-A) 0.027-0.315 % SOLN, Place 1 drop into both eyes as needed (eye allergies)., Disp: , Rfl:    venlafaxine XR (EFFEXOR-XR) 37.5 MG 24 hr capsule, Take 37.5 mg by mouth at bedtime., Disp: , Rfl:    zinc gluconate 50 MG  tablet, Take 50 mg by mouth 4 (four) times a week., Disp: , Rfl:   Review of Symptoms: Pertinent positives as per HPI.  Physical Exam: Deferred given limitations of phone visit.  Laboratory & Radiologic Studies: See above  Assessment & Plan: Mary Juarez is a 69 y.o. 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   Discussed recent MRI results.  Overall very reassuring.  No pelvic pathology.  There appears to be a fat plane between the rectum and the uterus.  Cervical mass noted.  We reviewed extensively again her workup to date with more recent postmenopausal bleeding.  She is feeling somewhat overwhelmed and uncomfortable with the idea of hysterectomy.  She was interested in hearing whether we could avoid doing a hysterectomy at this time.  Reviewed again the separate issues for which she was sent back to my clinic including vaginal Pap showing low-grade dysplasia, high risk HPV.  For this, she will need colposcopic examination, possible vaginal biopsies.  We discussed that low-grade dysplasia is followed closely without treatment given the low risk of progression to cancer.  With regard to the cervical mass, no mass appreciated on pelvic MRI.  Will confirm this at the time of surgery.  Lastly, we discussed her postmenopausal bleeding.  This still needs additional workup including pelvic exam, lysis of upper vaginal adhesions, possible cervical or upper vaginal biopsy, and endometrial sampling.  Given her strong desire to avoid definitive surgery at this time, I offered that we could proceed with EUA, lysis of vaginal adhesions, hysteroscopy, endometrial sampling (all with ultrasound guidance if needed), vaginoscopy, possible vaginal  biopsies.  I discussed the risk that she could have recurrent postmenopausal bleeding  that would ultimately require definitive treatment in the future.  Voices understanding of this risk.  I will reach out to my office to have her procedure changed.  I discussed the assessment and treatment plan with the patient. The patient was provided with an opportunity to ask questions and all were answered. The patient agreed with the plan and demonstrated an understanding of the instructions.   The patient was advised to call back or see an in-person evaluation if the symptoms worsen or if the condition fails to improve as anticipated.   28 minutes of total time was spent for this patient encounter, including preparation, phone counseling with the patient and coordination of care, and documentation of the encounter.   Wiley Hanger, MD  Division of Gynecologic Oncology  Department of Obstetrics and Gynecology  [facility]

## 2024-04-26 ENCOUNTER — Telehealth: Payer: Self-pay | Admitting: *Deleted

## 2024-04-26 NOTE — Telephone Encounter (Signed)
 Spoke with Ms. Pedreira who called the office requesting a copy of her procedure instructions change from hysterectomy to hysteroscopy, D & C and biopsy.   Pt also wanted to make sure that her procedure with Dr. Orvil Bland on May 22nd will be pre-authorized. Advised patient that the office is aware of the changes in her procedure and we are already working on the details. Also advised patient that we will have the nurse practitioner type up another hard copy that patient can pick up on Monday after her pre-admission appointment at [facility]. Pt prefers a hard copy like the first one she received because she doesn't have a computer to look at it in MyChart? Pt thanked the office and had no further concerns at this time.

## 2024-04-27 ENCOUNTER — Other Ambulatory Visit: Payer: Self-pay | Admitting: Family Medicine

## 2024-04-27 ENCOUNTER — Encounter: Payer: Self-pay | Admitting: Gynecologic Oncology

## 2024-04-27 ENCOUNTER — Ambulatory Visit
Admission: RE | Admit: 2024-04-27 | Discharge: 2024-04-27 | Disposition: A | Discharge status: Home / Self Care | Payer: No Typology Code available for payment source | Source: Ambulatory Visit | Attending: Family Medicine | Admitting: Family Medicine

## 2024-04-27 DIAGNOSIS — N6310 Unspecified lump in the right breast, unspecified quadrant: Secondary | ICD-10-CM

## 2024-04-27 NOTE — Patient Instructions (Signed)
 SURGICAL WAITING ROOM VISITATION  Patients having surgery or a procedure may have no more than 2 support people in the waiting area - these visitors may rotate.    Children under the age of 13 must have an adult with them who is not the patient.  Due to an increase in RSV and influenza rates and associated hospitalizations, children ages 78 and under may not visit patients in [facility] hospitals.  Visitors with respiratory illnesses are discouraged from visiting and should remain at home.  If the patient needs to stay at the hospital during part of their recovery, the visitor guidelines for inpatient rooms apply. Pre-op nurse will coordinate an appropriate time for 1 support person to accompany patient in pre-op.  This support person may not rotate.    Please refer to the [facility] website for the visitor guidelines for Inpatients (after your surgery is over and you are in a regular room).       Your procedure is scheduled on: 05-10-24   Report to [facility] Main Entrance    Report to admitting at       0515  AM   Call this number if you have problems the morning of surgery [phone]  Eat a light diet the day before surgery.  Examples including soups, broths, toast, yogurt, mashed potatoes.  Things to avoid include carbonated beverages (fizzy beverages), raw fruits and raw vegetables, or beans.   If your bowels are filled with gas, your surgeon will have difficulty visualizing your pelvic organs which increases your surgical risks.   Do not eat food :After Midnight.   After Midnight you may have the following liquids until _0430_____ AM/  DAY OF SURGERY   then nothing by mouth  Water Non-Citrus Juices (without pulp, NO RED-Apple, White grape, White cranberry) Black Coffee (NO MILK/CREAM OR CREAMERS, sugar ok)  Clear Tea (NO MILK/CREAM OR CREAMERS, sugar ok) regular and decaf                             Plain Jell-O (NO RED)                                            Fruit ices (not with fruit pulp, NO RED)                                     Popsicles (NO RED)                                                               Sports drinks like Gatorade (NO RED)              .                If you have questions, please contact your surgeon's office.   FOLLOW  ANY ADDITIONAL PRE OP INSTRUCTIONS YOU RECEIVED FROM YOUR SURGEON'S OFFICE!!!     Oral Hygiene is also important to reduce your risk of infection.  Remember - BRUSH YOUR TEETH THE MORNING OF SURGERY WITH YOUR REGULAR TOOTHPASTE  DENTURES WILL BE REMOVED PRIOR TO SURGERY PLEASE DO NOT APPLY "Poly grip" OR ADHESIVES!!!   Do NOT smoke after Midnight   Stop all vitamins and herbal supplements 7 days before surgery.   Take these medicines the morning of surgery with A SIP OF WATER: eye drops if needed    Bring CPAP mask and tubing day of surgery.                              You may not have any metal on your body including hair pins, jewelry, and body piercing             Do not wear make-up, lotions, powders, perfumes/cologne, or deodorant  Do not wear nail polish including gel and S&S, artificial/acrylic nails, or any other type of covering on natural nails including finger and toenails. If you have artificial nails, gel coating, etc. that needs to be removed by a nail salon please have this removed prior to surgery or surgery may need to be canceled/ delayed if the surgeon/ anesthesia feels like they are unable to be safely monitored.   Do not shave  48 hours prior to surgery.    Do not bring valuables to the hospital. [facility] IS NOT             RESPONSIBLE   FOR VALUABLES.   Contacts, glasses, dentures or bridgework may not be worn into surgery.    DO NOT BRING YOUR HOME MEDICATIONS TO THE HOSPITAL. PHARMACY WILL DISPENSE MEDICATIONS LISTED ON YOUR MEDICATION LIST TO YOU DURING YOUR ADMISSION IN THE HOSPITAL!    Patients discharged on the  day of surgery will not be allowed to drive home.  Someone NEEDS to stay with you for the first 24 hours after anesthesia.   Special Instructions: Bring a copy of your healthcare power of attorney and living will documents the day of surgery if you haven't scanned them before.              Please read over the following fact sheets you were given: IF YOU HAVE QUESTIONS ABOUT YOUR PRE-OP INSTRUCTIONS PLEASE CALL [phone]    If you test positive for Covid or have been in contact with anyone that has tested positive in the last 10 days please notify you surgeon.     - Preparing for Surgery Before surgery, you can play an important role.  Because skin is not sterile, your skin needs to be as free of germs as possible.  You can reduce the number of germs on your skin by washing with CHG (chlorahexidine gluconate) soap before surgery.  CHG is an antiseptic cleaner which kills germs and bonds with the skin to continue killing germs even after washing. Please DO NOT use if you have an allergy to CHG or antibacterial soaps.  If your skin becomes reddened/irritated stop using the CHG and inform your nurse when you arrive at Short Stay. Do not shave (including legs and underarms) for at least 48 hours prior to the first CHG shower.  You may shave your face/neck. Please follow these instructions carefully:  1.  Shower with CHG Soap the night before surgery and the  morning of Surgery.  2.  If you choose to wash your hair, wash your hair first as usual with your  normal  shampoo.  3.  After  you shampoo, rinse your hair and body thoroughly to remove the  shampoo.                           4.  Use CHG as you would any other liquid soap.  You can apply chg directly  to the skin and wash                       Gently with a scrungie or clean washcloth.  5.  Apply the CHG Soap to your body ONLY FROM THE NECK DOWN.   Do not use on face/ open                           Wound or open sores. Avoid contact  with eyes, ears mouth and genitals (private parts).                       Wash face,  Genitals (private parts) with your normal soap.             6.  Wash thoroughly, paying special attention to the area where your surgery  will be performed.  7.  Thoroughly rinse your body with warm water from the neck down.  8.  DO NOT shower/wash with your normal soap after using and rinsing off  the CHG Soap.                9.  Pat yourself dry with a clean towel.            10.  Wear clean pajamas.            11.  Place clean sheets on your bed the night of your first shower and do not  sleep with pets. Day of Surgery : Do not apply any lotions/deodorants the morning of surgery.  Please wear clean clothes to the hospital/surgery center.  FAILURE TO FOLLOW THESE INSTRUCTIONS MAY RESULT IN THE CANCELLATION OF YOUR SURGERY PATIENT SIGNATURE_________________________________  NURSE SIGNATURE__________________________________  ________________________________________________________________________ WHAT IS A BLOOD TRANSFUSION? Blood Transfusion Information  A transfusion is the replacement of blood or some of its parts. Blood is made up of multiple cells which provide different functions. Red blood cells carry oxygen and are used for blood loss replacement. White blood cells fight against infection. Platelets control bleeding. Plasma helps clot blood. Other blood products are available for specialized needs, such as hemophilia or other clotting disorders. BEFORE THE TRANSFUSION  Who gives blood for transfusions?  Healthy volunteers who are fully evaluated to make sure their blood is safe. This is blood bank blood. Transfusion therapy is the safest it has ever been in the practice of medicine. Before blood is taken from a donor, a complete history is taken to make sure that person has no history of diseases nor engages in risky social behavior (examples are intravenous drug use or sexual activity with  multiple partners). The donor's travel history is screened to minimize risk of transmitting infections, such as malaria. The donated blood is tested for signs of infectious diseases, such as HIV and hepatitis. The blood is then tested to be sure it is compatible with you in order to minimize the chance of a transfusion reaction. If you or a relative donates blood, this is often done in anticipation of surgery and is not appropriate for emergency situations. It takes many days to process the  donated blood. RISKS AND COMPLICATIONS Although transfusion therapy is very safe and saves many lives, the main dangers of transfusion include:  Getting an infectious disease. Developing a transfusion reaction. This is an allergic reaction to something in the blood you were given. Every precaution is taken to prevent this. The decision to have a blood transfusion has been considered carefully by your caregiver before blood is given. Blood is not given unless the benefits outweigh the risks. AFTER THE TRANSFUSION Right after receiving a blood transfusion, you will usually feel much better and more energetic. This is especially true if your red blood cells have gotten low (anemic). The transfusion raises the level of the red blood cells which carry oxygen, and this usually causes an energy increase. The nurse administering the transfusion will monitor you carefully for complications. HOME CARE INSTRUCTIONS  No special instructions are needed after a transfusion. You may find your energy is better. Speak with your caregiver about any limitations on activity for underlying diseases you may have. SEEK MEDICAL CARE IF:  Your condition is not improving after your transfusion. You develop redness or irritation at the intravenous (IV) site. SEEK IMMEDIATE MEDICAL CARE IF:  Any of the following symptoms occur over the next 12 hours: Shaking chills. You have a temperature by mouth above 102 F (38.9 C), not controlled by  medicine. Chest, back, or muscle pain. People around you feel you are not acting correctly or are confused. Shortness of breath or difficulty breathing. Dizziness and fainting. You get a rash or develop hives. You have a decrease in urine output. Your urine turns a dark color or changes to pink, red, or brown. Any of the following symptoms occur over the next 10 days: You have a temperature by mouth above 102 F (38.9 C), not controlled by medicine. Shortness of breath. Weakness after normal activity. The white part of the eye turns yellow (jaundice). You have a decrease in the amount of urine or are urinating less often. Your urine turns a dark color or changes to pink, red, or brown. Document Released: 12/03/2000 Document Revised: 02/28/2012 Document Reviewed: 07/22/2008 [facility] Patient Information 2014 [place], [place].  _______________________________________________________________________

## 2024-04-30 ENCOUNTER — Other Ambulatory Visit: Payer: Self-pay

## 2024-04-30 ENCOUNTER — Other Ambulatory Visit (HOSPITAL_COMMUNITY): Payer: Self-pay | Admitting: Gynecologic Oncology

## 2024-04-30 ENCOUNTER — Encounter (HOSPITAL_COMMUNITY): Payer: Self-pay

## 2024-04-30 ENCOUNTER — Encounter (HOSPITAL_COMMUNITY)
Admission: RE | Admit: 2024-04-30 | Discharge: 2024-04-30 | Disposition: A | Discharge status: Home / Self Care | Source: Ambulatory Visit | Attending: Gynecologic Oncology | Admitting: Gynecologic Oncology

## 2024-04-30 DIAGNOSIS — N950 Postmenopausal bleeding: Secondary | ICD-10-CM | POA: Insufficient documentation

## 2024-04-30 DIAGNOSIS — Z01812 Encounter for preprocedural laboratory examination: Secondary | ICD-10-CM | POA: Diagnosis present

## 2024-04-30 HISTORY — DX: Inflammatory liver disease, unspecified: K75.9

## 2024-04-30 HISTORY — DX: Unspecified osteoarthritis, unspecified site: M19.90

## 2024-04-30 LAB — COMPREHENSIVE METABOLIC PANEL WITH GFR
ALT: 32 U/L (ref 0–44)
AST: 27 U/L (ref 15–41)
Albumin: 4.3 g/dL (ref 3.5–5.0)
Alkaline Phosphatase: 64 U/L (ref 38–126)
Anion gap: 10 (ref 5–15)
BUN: 16 mg/dL (ref 8–23)
CO2: 24 mmol/L (ref 22–32)
Calcium: 9.6 mg/dL (ref 8.9–10.3)
Chloride: 103 mmol/L (ref 98–111)
Creatinine, Ser: 0.56 mg/dL (ref 0.44–1.00)
GFR, Estimated: >60 mL/min (ref >60)
Glucose, Bld: 92 mg/dL (ref 70–99)
Potassium: 4.4 mmol/L (ref 3.5–5.1)
Sodium: 137 mmol/L (ref 135–145)
Total Bilirubin: 0.8 mg/dL (ref 0.0–1.2)
Total Protein: 7.2 g/dL (ref 6.5–8.1)

## 2024-04-30 LAB — CBC
HCT: 40.3 % (ref 36.0–46.0)
Hemoglobin: 13.2 g/dL (ref 12.0–15.0)
MCH: 28 pg (ref 26.0–34.0)
MCHC: 32.8 g/dL (ref 30.0–36.0)
MCV: 85.4 fL (ref 80.0–100.0)
Platelets: 284 10*3/uL (ref 150–400)
RBC: 4.72 MIL/uL (ref 3.87–5.11)
RDW: 12.5 % (ref 11.5–15.5)
WBC: 5.3 10*3/uL (ref 4.0–10.5)
nRBC: 0 % (ref 0.0–0.2)

## 2024-04-30 NOTE — Progress Notes (Signed)
 COVID Vaccine received:  []  No [x]  Yes Date of any COVID positive Test in last 90 days: no PCP - Dr.  Lanae Pinal Cardiologist - n/a  Chest x-ray -  EKG -   Stress Test -  ECHO -  Cardiac Cath -   Bowel Prep - [x]  No  []   Yes ______  Pacemaker / ICD device [x]  No []  Yes   Spinal Cord Stimulator:[x]  No []  Yes       History of Sleep Apnea? [x]  No []  Yes   CPAP used?- [x]  No []  Yes    Does the patient monitor blood sugar?          [x]  No []  Yes  []  N/A  Patient has: [x]  NO Hx DM   []  Pre-DM                 []  DM1  []   DM2 Does patient have a [employer] or Dexacom? []  No []  Yes   Fasting Blood Sugar Ranges-  Checks Blood Sugar _____ times a day  GLP1 agonist / usual dose - no GLP1 instructions:  SGLT-2 inhibitors / usual dose - no SGLT-2 instructions:   Blood Thinner / Instructions:no Aspirin Instructions:no  Comments:   Activity level: Patient is able to climb a flight of stairs without difficulty; [x]  No CP  [x]  No SOB,   Patient can perform ADLs without assistance.   Anesthesia review:   Patient denies shortness of breath, fever, cough and chest pain at PAT appointment.  Patient verbalized understanding and agreement to the Pre-Surgical Instructions that were given to them at this PAT appointment. Patient was also educated of the need to review these PAT instructions again prior to his/her surgery.I reviewed the appropriate phone numbers to call if they have any and questions or concerns.

## 2024-05-01 ENCOUNTER — Encounter (HOSPITAL_COMMUNITY): Payer: Self-pay | Admitting: Gynecologic Oncology

## 2024-05-08 ENCOUNTER — Telehealth: Payer: Self-pay | Admitting: Oncology

## 2024-05-08 ENCOUNTER — Inpatient Hospital Stay: Admitting: Gynecologic Oncology

## 2024-05-08 NOTE — Telephone Encounter (Signed)
 Mary Juarez regarding her procedure with Dr. Orvil Bland on 05/10/2024.  Advised she will  be having a D&C, hysteroscopy like she has had in the past.  Advised we can cancel the telephone visit with Mary Juarez this afternoon if she is ok with that.  Mary Juarez said she would like to cancel the appointment.    Reviewed that she can follow her normal diet the day before surgery.  Mary Juarez reports having a lot of gas in her stomach and asked if there is anything over the counter that she can use.  Advised she can try Gas-X if needed.  Advised her that Mary Juarez will call her tomorrow to go over the last time she can eat and when she will need to arrive at the hospital.  Mary Juarez verbalized understanding and knows to call if she has any further questions.

## 2024-05-09 ENCOUNTER — Telehealth: Payer: Self-pay | Admitting: *Deleted

## 2024-05-09 NOTE — Telephone Encounter (Signed)
 Attempted to reach patient for pre-op call. Left voicemail requesting call back.

## 2024-05-09 NOTE — Anesthesia Preprocedure Evaluation (Addendum)
 Anesthesia Evaluation  Patient identified by MRN, date of birth, ID band Patient awake    Reviewed: Allergy & Precautions, NPO status , Patient's Chart, lab work & pertinent test results  History of Anesthesia Complications (+) history of anesthetic complications (hypotension with D&C in the 90s, reports taking weeks to recover after last surgery in terms of mentation)  Airway Mallampati: III  TM Distance: >3 FB Neck ROM: Full    Dental  (+) Dental Advisory Given   Pulmonary neg pulmonary ROS   Pulmonary exam normal breath sounds clear to auscultation       Cardiovascular (-) hypertension(-) angina (-) Past MI, (-) Cardiac Stents and (-) CABG (-) dysrhythmias  Rhythm:Regular Rate:Normal  Normal ETT 06/25/2015   Neuro/Psych neg Seizures PSYCHIATRIC DISORDERS Anxiety Depression    H/o concussion    GI/Hepatic negative GI ROS,,,(+) Hepatitis -  Endo/Other  negative endocrine ROS    Renal/GU negative Renal ROS     Musculoskeletal  (+) Arthritis ,    Abdominal   Peds  Hematology  (+) Blood dyscrasia (vWD, hemophilia A, 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 Patient reports never having bleeding issues with surgery or needing to take medication for bleeding prior to surgery) Lab Results      Component                Value               Date                      WBC                      5.3                 04/30/2024                HGB                      13.2                04/30/2024                HCT                      40.3                04/30/2024                MCV                      85.4                04/30/2024                PLT                      284                 04/30/2024              Anesthesia Other Findings   Reproductive/Obstetrics                             Anesthesia Physical Anesthesia Plan  ASA:  2  Anesthesia Plan: General   Post-op Pain Management: Tylenol   PO (pre-op)*   Induction: Intravenous  PONV Risk Score and Plan: 3 and Ondansetron , Dexamethasone , Treatment may vary due to age or medical condition, Propofol  infusion, TIVA and Midazolam   Airway Management Planned: LMA  Additional Equipment:   Intra-op Plan:   Post-operative Plan: Extubation in OR  Informed Consent: I have reviewed the patients History and Physical, chart, labs and discussed the procedure including the risks, benefits and alternatives for the proposed anesthesia with the patient or authorized representative who has indicated his/her understanding and acceptance.     Dental advisory given  Plan Discussed with: CRNA and Anesthesiologist  Anesthesia Plan Comments: (Risks of general anesthesia discussed including, but not limited to, sore throat, hoarse voice, chipped/damaged teeth, injury to vocal cords, nausea and vomiting, allergic reactions, lung infection, heart attack, stroke, and death. All questions answered. )        Anesthesia Quick Evaluation

## 2024-05-09 NOTE — Telephone Encounter (Signed)
 Telephone call to check on pre-operative status.  Patient compliant with pre-operative instructions.  Reinforced nothing to eat after midnight. Clear liquids until 0415. Patient to arrive at 0515.  No questions or concerns voiced.  Instructed to call for any needs.

## 2024-05-10 ENCOUNTER — Other Ambulatory Visit: Payer: Self-pay

## 2024-05-10 ENCOUNTER — Encounter (HOSPITAL_COMMUNITY)
Admission: RE | Disposition: A | Discharge status: Home / Self Care | Payer: Self-pay | Source: Ambulatory Visit | Attending: Gynecologic Oncology

## 2024-05-10 ENCOUNTER — Ambulatory Visit (HOSPITAL_BASED_OUTPATIENT_CLINIC_OR_DEPARTMENT_OTHER): Admitting: Anesthesiology

## 2024-05-10 ENCOUNTER — Ambulatory Visit (HOSPITAL_COMMUNITY)
Admission: RE | Admit: 2024-05-10 | Discharge: 2024-05-10 | Disposition: A | Discharge status: Home / Self Care | Source: Ambulatory Visit | Attending: Gynecologic Oncology | Admitting: Gynecologic Oncology

## 2024-05-10 ENCOUNTER — Ambulatory Visit (HOSPITAL_COMMUNITY): Admitting: Anesthesiology

## 2024-05-10 ENCOUNTER — Encounter (HOSPITAL_COMMUNITY): Payer: Self-pay | Admitting: Gynecologic Oncology

## 2024-05-10 DIAGNOSIS — Z538 Procedure and treatment not carried out for other reasons: Secondary | ICD-10-CM

## 2024-05-10 DIAGNOSIS — F418 Other specified anxiety disorders: Secondary | ICD-10-CM | POA: Diagnosis not present

## 2024-05-10 DIAGNOSIS — N952 Postmenopausal atrophic vaginitis: Secondary | ICD-10-CM | POA: Insufficient documentation

## 2024-05-10 DIAGNOSIS — R87612 Low grade squamous intraepithelial lesion on cytologic smear of cervix (LGSIL): Secondary | ICD-10-CM | POA: Diagnosis not present

## 2024-05-10 DIAGNOSIS — M1990 Unspecified osteoarthritis, unspecified site: Secondary | ICD-10-CM | POA: Diagnosis not present

## 2024-05-10 DIAGNOSIS — N950 Postmenopausal bleeding: Secondary | ICD-10-CM | POA: Diagnosis present

## 2024-05-10 DIAGNOSIS — D66 Hereditary factor VIII deficiency: Secondary | ICD-10-CM | POA: Diagnosis not present

## 2024-05-10 DIAGNOSIS — N905 Atrophy of vulva: Secondary | ICD-10-CM | POA: Diagnosis not present

## 2024-05-10 HISTORY — PX: HYSTEROSCOPY WITH D & C: SHX1775

## 2024-05-10 HISTORY — PX: OPERATIVE ULTRASOUND: SHX5996

## 2024-05-10 SURGERY — DILATATION AND CURETTAGE /HYSTEROSCOPY
Anesthesia: General

## 2024-05-10 MED ORDER — FENTANYL CITRATE PF 50 MCG/ML IJ SOSY: 25.0000 ug | PREFILLED_SYRINGE | INTRAMUSCULAR | Status: DC | PRN

## 2024-05-10 MED ORDER — ACETIC ACID 5 % SOLN
Status: DC | PRN
  Administered 2024-05-10: 1 via TOPICAL

## 2024-05-10 MED ORDER — LIDOCAINE HCL 1 % IJ SOLN
INTRAMUSCULAR | Status: AC
  Filled 2024-05-10: qty 20

## 2024-05-10 MED ORDER — ORAL CARE MOUTH RINSE: 15.0000 mL | Freq: Once | OROMUCOSAL | Status: DC

## 2024-05-10 MED ORDER — IODINE STRONG (LUGOLS) 5 % PO SOLN
ORAL | Status: AC
  Filled 2024-05-10: qty 1

## 2024-05-10 MED ORDER — OXYCODONE HCL 5 MG/5ML PO SOLN: 5.0000 mg | Freq: Once | ORAL | Status: DC | PRN

## 2024-05-10 MED ORDER — PROPOFOL 10 MG/ML IV BOLUS
INTRAVENOUS | Status: AC
  Filled 2024-05-10: qty 20

## 2024-05-10 MED ORDER — PROPOFOL 10 MG/ML IV BOLUS
INTRAVENOUS | Status: DC | PRN
  Administered 2024-05-10: 150 mg via INTRAVENOUS

## 2024-05-10 MED ORDER — ACETAMINOPHEN 500 MG PO TABS: 1000.0000 mg | ORAL_TABLET | Freq: Once | ORAL | Status: DC

## 2024-05-10 MED ORDER — MIDAZOLAM HCL 5 MG/5ML IJ SOLN
INTRAMUSCULAR | Status: DC | PRN
  Administered 2024-05-10: 2 mg via INTRAVENOUS

## 2024-05-10 MED ORDER — ONDANSETRON HCL 4 MG/2ML IJ SOLN
INTRAMUSCULAR | Status: DC | PRN
  Administered 2024-05-10: 4 mg via INTRAVENOUS

## 2024-05-10 MED ORDER — ACETAMINOPHEN 500 MG PO TABS
1000.0000 mg | ORAL_TABLET | ORAL | Status: AC
  Administered 2024-05-10: 1000 mg via ORAL
  Filled 2024-05-10: qty 2

## 2024-05-10 MED ORDER — MIDAZOLAM HCL 2 MG/2ML IJ SOLN
INTRAMUSCULAR | Status: AC
  Filled 2024-05-10: qty 2

## 2024-05-10 MED ORDER — FENTANYL CITRATE (PF) 100 MCG/2ML IJ SOLN
INTRAMUSCULAR | Status: AC
  Filled 2024-05-10: qty 2

## 2024-05-10 MED ORDER — LIDOCAINE HCL 1 % IJ SOLN: INTRAMUSCULAR | Status: DC | PRN

## 2024-05-10 MED ORDER — LIDOCAINE HCL (PF) 2 % IJ SOLN
INTRAMUSCULAR | Status: DC | PRN
Start: 2024-05-10 — End: 2024-05-10
  Administered 2024-05-10: 80 mg via INTRADERMAL

## 2024-05-10 MED ORDER — PROPOFOL 500 MG/50ML IV EMUL
INTRAVENOUS | Status: DC | PRN
  Administered 2024-05-10: 120 ug/kg/min via INTRAVENOUS

## 2024-05-10 MED ORDER — FENTANYL CITRATE (PF) 100 MCG/2ML IJ SOLN
INTRAMUSCULAR | Status: DC | PRN
  Administered 2024-05-10: 50 ug via INTRAVENOUS

## 2024-05-10 MED ORDER — KETOROLAC TROMETHAMINE 15 MG/ML IJ SOLN
INTRAMUSCULAR | Status: DC | PRN
  Administered 2024-05-10: 15 mg via INTRAVENOUS

## 2024-05-10 MED ORDER — DEXAMETHASONE SODIUM PHOSPHATE 10 MG/ML IJ SOLN
4.0000 mg | INTRAMUSCULAR | Status: AC
  Administered 2024-05-10: 4 mg via INTRAVENOUS

## 2024-05-10 MED ORDER — PHENYLEPHRINE 80 MCG/ML (10ML) SYRINGE FOR IV PUSH (FOR BLOOD PRESSURE SUPPORT)
PREFILLED_SYRINGE | INTRAVENOUS | Status: DC | PRN
  Administered 2024-05-10: 80 ug via INTRAVENOUS
  Administered 2024-05-10: 160 ug via INTRAVENOUS
  Administered 2024-05-10 (×3): 80 ug via INTRAVENOUS
  Administered 2024-05-10: 160 ug via INTRAVENOUS

## 2024-05-10 MED ORDER — MONSELS FERRIC SUBSULFATE EX SOLN
CUTANEOUS | Status: AC
  Filled 2024-05-10: qty 8

## 2024-05-10 MED ORDER — AMISULPRIDE (ANTIEMETIC) 5 MG/2ML IV SOLN: 10.0000 mg | Freq: Once | INTRAVENOUS | Status: DC | PRN

## 2024-05-10 MED ORDER — OXYCODONE HCL 5 MG PO TABS: 5.0000 mg | ORAL_TABLET | Freq: Once | ORAL | Status: DC | PRN

## 2024-05-10 MED ORDER — ACETIC ACID 5 % SOLN
Status: AC
  Filled 2024-05-10: qty 12

## 2024-05-10 MED ORDER — SODIUM CHLORIDE 0.9 % IR SOLN
Status: DC | PRN
  Administered 2024-05-10: 1000 mL

## 2024-05-10 MED ORDER — KETOROLAC TROMETHAMINE 15 MG/ML IJ SOLN
INTRAMUSCULAR | Status: AC
  Filled 2024-05-10: qty 1

## 2024-05-10 MED ORDER — CHLORHEXIDINE GLUCONATE 0.12 % MT SOLN: 15.0000 mL | Freq: Once | OROMUCOSAL | Status: DC

## 2024-05-10 MED ORDER — LACTATED RINGERS IV SOLN: INTRAVENOUS | Status: DC | PRN

## 2024-05-10 MED ORDER — EPHEDRINE SULFATE-NACL 50-0.9 MG/10ML-% IV SOSY
PREFILLED_SYRINGE | INTRAVENOUS | Status: DC | PRN
  Administered 2024-05-10: 5 mg via INTRAVENOUS
  Administered 2024-05-10: 10 mg via INTRAVENOUS

## 2024-05-10 SURGICAL SUPPLY — 20 items
BAG COUNTER SPONGE SURGICOUNT (BAG) ×2 IMPLANT
DEVICE MYOSURE LITE (MISCELLANEOUS) IMPLANT
DEVICE MYOSURE REACH (MISCELLANEOUS) IMPLANT
DILATOR CANAL MILEX (MISCELLANEOUS) IMPLANT
DRAPE SURG IRRIG POUCH 19X23 (DRAPES) IMPLANT
GAUZE 4X4 16PLY ~~LOC~~+RFID DBL (SPONGE) IMPLANT
GLOVE BIO SURGEON STRL SZ 6 (GLOVE) ×4 IMPLANT
GLOVE SURG SS PI 6.0 STRL IVOR (GLOVE) IMPLANT
GOWN STRL REUS W/ TWL LRG LVL3 (GOWN DISPOSABLE) ×2 IMPLANT
IV NS IRRIG 3000ML ARTHROMATIC (IV SOLUTION) ×2 IMPLANT
KIT PROCEDURE FLUENT (KITS) IMPLANT
KIT TURNOVER KIT A (KITS) IMPLANT
LOOP CUTTING BIPOLAR 21FR (ELECTRODE) IMPLANT
PACK VAGINAL WOMENS (CUSTOM PROCEDURE TRAY) ×2 IMPLANT
PAD OB MATERNITY 11 LF (PERSONAL CARE ITEMS) IMPLANT
SEAL ROD LENS SCOPE MYOSURE (ABLATOR) IMPLANT
SYR BULB IRRIG 60ML STRL (SYRINGE) ×2 IMPLANT
SYSTEM TISS REMOVAL MYOSURE XL (MISCELLANEOUS) IMPLANT
TOWEL OR 17X26 10 PK STRL BLUE (TOWEL DISPOSABLE) ×2 IMPLANT
WATER STERILE IRR 500ML POUR (IV SOLUTION) ×2 IMPLANT

## 2024-05-10 NOTE — Discharge Instructions (Addendum)
 AFTER SURGERY INSTRUCTIONS   Return to work: 24 hours if applicable  During the procedure, there were two openings at the top of the vagina. There was some blood and discharge that came out of one opening and this tract was not in the cervix and looked close to the colon. The other area that looked promising for the opening of the cervix was dilated and a camera was placed in. It did not look like that opening was definitively in the cervix. Given how close both areas were to the colon, Dr. Orvil Bland felt it was too risky to proceed. There is not a safe way to evaluate the inside of the uterus without doing a hysterectomy. She did not see anything concerning in the vagina but she took a biopsy given your recent pap smear results.    -No driving for minimum 24 hours after surgery.  Do not drive if you are taking narcotic pain medicine and make sure that your reaction time has returned.    -No tub baths or submerging your body in water until cleared by your surgeon (2 weeks minimum).    -No sexual activity and nothing in the vagina for 2 weeks.   -You may experience vaginal spotting and discharge after surgery.  The spotting is normal but if you experience heavy bleeding, call our office.   -Take Tylenol  and ibuprofen for pain if you are able to take these medications for discomfort as needed.     Diet: 1. Low sodium Heart Healthy Diet is recommended but you are cleared to resume your normal (before surgery) diet after your procedure.   2. It is safe to use a laxative, such as Miralax or Colace, if you have difficulty moving your bowels.    Wound Care: 1. Keep clean and dry.  Shower daily.   Reasons to call the Doctor: Fever - Oral temperature greater than 100.4 degrees Fahrenheit Foul-smelling vaginal discharge Difficulty urinating Nausea and vomiting Difficulty breathing with or without chest pain New calf pain especially if only on one side Sudden, continuing increased vaginal bleeding  with or without clots.   Contacts: For questions or concerns you should contact:   Dr. Wiley Hanger at [phone]   Vira Grieves, NP at [phone]   After Hours: call [phone] and have the GYN Oncologist paged/contacted (after 5 pm or on the weekends).   Messages sent via mychart are for non-urgent matters and are not responded to after hours so for urgent needs, please call the after hours number.

## 2024-05-10 NOTE — Op Note (Addendum)
 OPERATIVE NOTE   PATIENT: Mary Juarez DATE: 05/11/23    Preop Diagnosis: PMB, abnormal Pap test   Postoperative Diagnosis: Significant vaginal atrophy   Surgery: Exam under anesthesia, attempted dilation of the cervix, attempted hysteroscopy, vaginal biopsy   Surgeons:  Wiley Hanger, MD   Assistant: none   Anesthesia: General    Estimated blood loss: 5 ml   IVF:  see I&O flowsheet    Urine output: n/a    Complications: None apparent   Pathology: Vaginal cuff biopsy, midline   Operative findings: ON vaginal exam, smooth vaginal mucosa.  Cervix difficult to palpate on vaginal exam.  On rectovaginal exam, small mobile uterus.  On speculum exam, moderate vaginal atrophy noted.  After application of acetic acid, no significant areas of acetowhite changes.  Biopsy taken at the vaginal apex.  There was what appeared to be a passage and possible cervical os at the right aspect of the vagina.  A second very small dimple was noted at the left vaginal apex.  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  Given no definitive track within the cervix and concern for possible unavoidable damage to the colon if I proceeded, decision made to abort procedure.   Procedure: The patient was identified in the preoperative holding area. Informed consent was signed on the chart. Patient was seen history was reviewed and exam was performed.    The patient was then taken to the operating room and placed in the supine position with SCD hose on. General anesthesia was then induced without difficulty. She was then placed in the dorsolithotomy position. The perineum was prepped with Betadine. The vagina was prepped with Betadine. The patient was then draped after the prep was dried.    Timeout was performed the patient, procedure, antibiotic, allergy, and  length of procedure. Vaginal exam was performed as well as a rectovaginal exam with findings noted above.  Speculum was placed in the vagina.  5% acetic acid was applied to the entire vagina with findings discussed above.  Tischler forceps were used to take a biopsy at the vaginal apex.  This was handed off the field.  A uterine sound was used to gently cannulate the opening at the right vaginal apex under ultrasound guidance.  There was initially minimal blood noted from this opening followed by more brown serous fluid.  It was difficult to visualize where the sound was.  With the sound still in this tissue opening, speculum was removed and a rectal exam was performed.  The sound was not palpated within the rectum but was palpated just anterior to it.  The sound did not feel like it was in the cervix.  The sound was removed.  Contents seen coming out of the patient's anus was different in color and texture than what had been seen to drain from the right sided vaginal opening.  Surgeons gloves were changed.  Speculum was replaced in the vagina.  Tenaculum was used to grasp the tissue just inferior to the tissue dimple in the left vaginal apex.  Under ultrasound guidance, dilators were used to gently cannulate this opening (up to an 11 F) to a depth of approximately 2 cm under ultrasound guidance.  Again it was quite difficult to tell where the sound was and rectal exam was performed with the dilator in place.    Surgeon's  gloves were then changed. The diagnostic hysteroscope was then inserted into this opening to a depth of approximately 2 cm where no additional track was apparent either consistent with not being in the endocervical canal or significant adhesions.  Under ultrasound guidance, it appeared that I was inferior to the cervix and the track necessary to get into the uterine cavity.  No other apparent track was visible.    All instruments were removed from the vagina.   All instrument, suture,  laparotomy, Ray-Tec, and needle counts were correct x2. The patient tolerated the procedure well and was taken recovery room in stable condition.    Suzi Essex, MD

## 2024-05-10 NOTE — Anesthesia Postprocedure Evaluation (Signed)
 Anesthesia Post Note  Patient: Mary Juarez  Procedure(s) Performed: EVALUATION UNDER ANESTHESIA, VAGINAL BIOPSIES, ATTEMPTED CERVICAL DILATION (Bilateral) US  INTRAOPERATIVE     Patient location during evaluation: PACU Anesthesia Type: General Level of consciousness: awake Pain management: pain level controlled Vital Signs Assessment: post-procedure vital signs reviewed and stable Respiratory status: spontaneous breathing, nonlabored ventilation and respiratory function stable Cardiovascular status: blood pressure returned to baseline and stable Postop Assessment: no apparent nausea or vomiting Anesthetic complications: no   No notable events documented.  Last Vitals:  Vitals:   05/10/24 0930 05/10/24 0945  BP: (!) 119/52 138/71  Pulse: (!) 52 (!) 56  Resp: 13 13  Temp: (!) 36.3 C 36.5 C  SpO2: 99% 100%    Last Pain:  Vitals:   05/10/24 0945  TempSrc:   PainSc: 0-No pain                 Conard Decent

## 2024-05-10 NOTE — Transfer of Care (Signed)
 Immediate Anesthesia Transfer of Care Note  Patient: Mary Juarez  Procedure(s) Performed: EVALUATION UNDER ANESTHESIA, VAGINAL BIOPSIES, ATTEMPTED CERVICAL DILATION (Bilateral) US  INTRAOPERATIVE  Patient Location: PACU  Anesthesia Type:General  Level of Consciousness: awake and alert   Airway & Oxygen Therapy: Patient Spontanous Breathing and Patient connected to face mask oxygen  Post-op Assessment: Report given to RN and Post -op Vital signs reviewed and stable  Post vital signs: Reviewed and stable  Last Vitals:  Vitals Value Taken Time  BP 132/108 05/10/24 0836  Temp 36.1 C 05/10/24 0836  Pulse 59 05/10/24 0840  Resp 14 05/10/24 0840  SpO2 100 % 05/10/24 0840  Vitals shown include unfiled device data.  Last Pain:  Vitals:   05/10/24 0836  TempSrc:   PainSc: 0-No pain         Complications: No notable events documented.

## 2024-05-10 NOTE — Interval H&P Note (Signed)
 History and Physical Interval Note:  05/10/2024 6:34 AM  Mary Juarez  has presented today for surgery, with the diagnosis of POST MENOPAUSAL BLEEDING POSSIBLE CERVICAL MASS.  The various methods of treatment have been discussed with the patient and family. After consideration of risks, benefits and other options for treatment, the patient has consented to  Procedure(s) with comments: DILATATION AND CURETTAGE /HYSTEROSCOPY (Bilateral) - D AND C HYSTEROSCOPY WITH POSSIBLE CERVICAL BIOPSY AND POSSIBLE VAGINAL BIOPSY US  INTRAOPERATIVE (N/A) as a surgical intervention.  The patient's history has been reviewed, patient examined, no change in status, stable for surgery.  I have reviewed the patient's chart and labs.  Questions were answered to the patient's satisfaction.     Suzi Essex

## 2024-05-10 NOTE — Anesthesia Procedure Notes (Signed)
 Procedure Name: LMA Insertion Date/Time: 05/10/2024 7:39 AM  Performed by: Josetta Niece, CRNAPre-anesthesia Checklist: Patient identified, Emergency Drugs available, Suction available and Patient being monitored Patient Re-evaluated:Patient Re-evaluated prior to induction Oxygen Delivery Method: Circle System Utilized Preoxygenation: Pre-oxygenation with 100% oxygen Induction Type: IV induction Ventilation: Mask ventilation without difficulty LMA: LMA inserted LMA Size: 4.0 Number of attempts: 1 Placement Confirmation: positive ETCO2 Tube secured with: Tape Dental Injury: Teeth and Oropharynx as per pre-operative assessment

## 2024-05-11 ENCOUNTER — Encounter (HOSPITAL_COMMUNITY): Payer: Self-pay | Admitting: Gynecologic Oncology

## 2024-05-11 ENCOUNTER — Telehealth: Payer: Self-pay | Admitting: *Deleted

## 2024-05-11 ENCOUNTER — Ambulatory Visit: Payer: Self-pay | Admitting: Gynecologic Oncology

## 2024-05-11 ENCOUNTER — Telehealth: Payer: Self-pay

## 2024-05-11 ENCOUNTER — Telehealth: Payer: Self-pay | Admitting: Gynecologic Oncology

## 2024-05-11 DIAGNOSIS — N898 Other specified noninflammatory disorders of vagina: Secondary | ICD-10-CM

## 2024-05-11 LAB — SURGICAL PATHOLOGY

## 2024-05-11 NOTE — Telephone Encounter (Signed)
 Called the patient to check-in after her procedure yesterday.  She notes doing very well.  We discussed in detail findings at the time of surgery.  I have voiced my concerns that surgical findings may represent a colovaginal fistula.  I discussed her case with Dr. Andy Bannister and Dr. Camilo Cella yesterday.  Patient is amenable with proceeding with a CT scan with rectal contrast.  If this shows a fistula between her colon and vagina, we discussed plan to proceed with surgery to repair the fistula and perform concurrent total hysterectomy.  Will call her with the CT results.  Wiley Hanger MD Gynecologic Oncology

## 2024-05-11 NOTE — Telephone Encounter (Signed)
 Spoke with Ms. Mary Juarez this morning. She states she is eating, drinking and urinating well. She has not had a BM yet but is passing gas. She is taking senokot as prescribed and encouraged her to drink plenty of water. She denies fever or chills. She rates her pain 3/10. Her pain is controlled with tylenol .    Instructed to call office with any fever, chills, purulent drainage, uncontrolled pain or any other questions or concerns. Patient verbalizes understanding.   Pt aware of post op appointments as well as the office number [phone] and after hours number [phone] to call if she has any questions or concerns

## 2024-05-11 NOTE — Telephone Encounter (Signed)
 Spoke with patient and gave her information about the CT scan scheduled for 6/3 at 2pm..

## 2024-05-13 ENCOUNTER — Encounter: Payer: Self-pay | Admitting: Gynecologic Oncology

## 2024-05-22 ENCOUNTER — Ambulatory Visit (HOSPITAL_COMMUNITY)
Admission: RE | Admit: 2024-05-22 | Discharge: 2024-05-22 | Disposition: A | Discharge status: Home / Self Care | Source: Ambulatory Visit | Attending: Gynecologic Oncology | Admitting: Gynecologic Oncology

## 2024-05-22 DIAGNOSIS — N898 Other specified noninflammatory disorders of vagina: Secondary | ICD-10-CM | POA: Insufficient documentation

## 2024-05-22 DIAGNOSIS — Z0389 Encounter for observation for other suspected diseases and conditions ruled out: Secondary | ICD-10-CM | POA: Diagnosis not present

## 2024-05-22 MED ORDER — SODIUM CHLORIDE (PF) 0.9 % IJ SOLN
INTRAMUSCULAR | Status: AC
  Filled 2024-05-22: qty 50

## 2024-05-22 MED ORDER — IOHEXOL 300 MG/ML  SOLN
100.0000 mL | Freq: Once | INTRAMUSCULAR | Status: AC | PRN
  Administered 2024-05-22: 100 mL via INTRAVENOUS

## 2024-05-23 ENCOUNTER — Ambulatory Visit: Payer: Self-pay | Admitting: Gynecologic Oncology

## 2024-05-24 ENCOUNTER — Telehealth: Payer: Self-pay | Admitting: Gynecologic Oncology

## 2024-05-24 ENCOUNTER — Other Ambulatory Visit: Payer: Self-pay | Admitting: Gynecologic Oncology

## 2024-05-24 DIAGNOSIS — N952 Postmenopausal atrophic vaginitis: Secondary | ICD-10-CM

## 2024-05-24 MED ORDER — ESTRADIOL 0.1 MG/GM VA CREA: 1.0000 | TOPICAL_CREAM | Freq: Every day | VAGINAL | 12 refills | Status: DC

## 2024-05-24 MED ORDER — ESTRADIOL 0.1 MG/GM VA CREA: 1.0000 | TOPICAL_CREAM | VAGINAL | 12 refills | Status: AC

## 2024-05-24 NOTE — Progress Notes (Signed)
 Called patient. Discussed CT results - no evidence of fistula. I still would recommend consideration of definitive surgery given anability to sample endometrium. She had bleeding for several days after surgery, no further bleeding or discharge. It is possible that the small tract that I gently dilated was the source of her bleeding and has not drained. She does not feel ready to move forward with major surgery. She would like to follow-up with me in 2 months. Phone visit scheduled.  Wiley Hanger MD Gynecologic Oncology

## 2024-05-24 NOTE — Telephone Encounter (Signed)
 Ms.Lafuente returned call from Dr.Tucker. She is aware Dr.Tucker is currently in the OR and will call pt back at her convenience. Pt voiced an understanding

## 2024-05-24 NOTE — Telephone Encounter (Signed)
 Called patient to discuss recent CT results. No answer. Left VM asking for callback to

## 2024-06-08 ENCOUNTER — Encounter: Admitting: Gynecologic Oncology

## 2024-07-22 ENCOUNTER — Encounter: Payer: Self-pay | Admitting: Gynecologic Oncology

## 2024-07-24 ENCOUNTER — Telehealth: Payer: Self-pay

## 2024-07-24 NOTE — Telephone Encounter (Signed)
 Left message for patient to let her know that the phone visit from 8/8 had been moved to 8/6.Mary Juarez

## 2024-07-25 ENCOUNTER — Inpatient Hospital Stay: Attending: Gynecologic Oncology | Admitting: Gynecologic Oncology

## 2024-07-25 ENCOUNTER — Encounter: Payer: Self-pay | Admitting: Gynecologic Oncology

## 2024-07-25 DIAGNOSIS — N950 Postmenopausal bleeding: Secondary | ICD-10-CM | POA: Diagnosis not present

## 2024-07-25 DIAGNOSIS — Z7189 Other specified counseling: Secondary | ICD-10-CM

## 2024-07-25 DIAGNOSIS — N952 Postmenopausal atrophic vaginitis: Secondary | ICD-10-CM

## 2024-07-25 DIAGNOSIS — R87622 Low grade squamous intraepithelial lesion on cytologic smear of vagina (LGSIL): Secondary | ICD-10-CM

## 2024-07-25 NOTE — Progress Notes (Signed)
 Gynecologic Oncology Telehealth Note: Gyn-Onc  I connected with Mary Juarez on 07/25/24 at  6:00 PM EDT by telephone and verified that I am speaking with the correct person using two identifiers.  I discussed the limitations, risks, security and privacy concerns of performing an evaluation and management service by telemedicine and the availability of in-person appointments. I also discussed with the patient that there may be a patient responsible charge related to this service. The patient expressed understanding and agreed to proceed.  Other persons participating in the visit and their role in the encounter: none.  Patient's location: home Provider's location: [facility]  Reason for Visit: treatment planning  Treatment History: Patient endorses menopause at the age of 48-43.  She denies any postmenopausal bleeding until December 10 at which time she had sharp left-sided pelvic pain.  She then had some older appearing spotting.  She called and saw her primary care provider that day.  She was referred to gynecology and ultimately was able to schedule an appointment.  She had work-up including pelvic exam, Pap test, ultrasound, and the most recently attempt at endometrial biopsy.  Her pelvic ultrasound, performed at [facility] on 2/15, showed a uterus measuring 5 x 2.5 x 2.7 cm with an endometrial lining of 4.6 mm.  Ovaries were normal in appearance.  Myometrium noted to be normal.  Pap test showed negative for intraepithelial lesion, HPV negative.  Endometrial biopsy on 2/24 showed rare benign endometrial fragments in the specimen consisting mainly of mucus and endocervical glands.  Given her discomfort and intolerance of exams, the patient required premedication with lorazepam  and Tylenol  prior to both pelvic exams and her procedure.  Her endometrial biopsy had to be aborted early due to patient discomfort.  Only a small sample was able to be obtained.   Prior to the biopsy, she notes having what  she describes as fluctuations in her hormone levels the past several months.  This was especially bothersome in January and she notes moodiness during this time.  With regard to her bleeding, she would have a spot of older appearing blood on the tissue paper when she wiped.  She also noted some bright red rectal bleeding that has been attributed to hemorrhoids.  She saw a gastroenterologist for this recently.  Since her attempted endometrial biopsy, she had a week of bright red bleeding, increased in quantity from what she was previously having.  Since Monday of this week, she denies any vaginal bleeding.   Overall, patient notes that her appetite has been decreased for a number of weeks.  She is having to make herself eat.  She notes having early satiety and occasional bloating.  She reports about a 40 pound weight loss in the last 8-12 months.  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  She denies any attempt at weight loss.  She notes regular bowel movements which is aided by her high consumption of vegetables.  She has become more regular in the last several months.  She denies any changes to urination, with increased frequency especially at night which she has had for decades.   She describes a significant GYN history with very painful and heavy periods from a young age.  She saw multiple providers for this and ultimately no cause was found.  At points previously, she would run to help deal with and treat her pain.  She had laparoscopic surgery  a number of years ago that she was thought perhaps to have endometriosis.  She was told after the surgery that she had extensive intra-abdominal scarring but the cause of this was unknown.  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  She had a previous attempted a D&C but this was abandoned due to inability to dilate the cervix and enter the uterine cavity.  She was  placed on Megace subsequently for several months.  She also had a hysterosalpingogram at some point to check the patency of her fallopian tubes.  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   Patient underwent hysteroscopy with MyoSure sampling on 03/17/21 in the setting of postmenopausal bleeding.  Pathology revealed benign endometrial polyp, atrophic endometrium, no hyperplasia or malignancy.   She presented again in late 2022 with 3 day episode of heavy vaginal bleeding. Pelvic ultrasound showed thickened endometrium.   01/19/22: Lysis of vagina adhesions, EUA, dilation of cervix under ultrasound guidance, hysteroscopy and endometrial and endocervical sampling with the Myosure Reach. Pathology revealed benign inactive endometrium and benign cervical glandular mucosa. No dysplasia, hyperplasia, or malignancy.   Patient presented to see her OB/GYN in February and endorsed postmenopausal bleeding for 8 months.  She described this as brownish-yellow spotting with red discharge with associated menstrual-like cramps.   Pelvic ultrasound 02/15/24 to evaluated postmenopausal bleeding. Uterus 6.3 x 2.5 x 2.8 cm, endometrium 2.1 mm. LUS difficult to evaluate but appeared enlarge and lobular measuring 3 x 2.3 x 2.3 cm. Cervix not clearly identified. Neither ovary visualized, no adnexal masses.    Pap test was performed on 02/17/24: LSIL, HR HPV detected. This was performed due to lobular appearance of lower uterine segment on ultrasound. Cervix not visualized on exam so blind pap performed.    Today, the patient reports having vaginal spotting or discharge for quite a while before she saw her OB/GYN.  She describes this discharge is unpredictable, sometimes brown, sometimes red, sometimes bright red bleeding.  At times it will be larger quantities which she describes as a glob that she sees on her depends.  Sometimes she passes brown junk.  After she  urinates, and sometimes after she has a bowel movement, she sees brown material when she wipes that Charles looks like what she would expect after wiping with a bowel movement.  She has not smelled this.  Bowel movements have been more regular than previously.  She endorses a bowel movement most days, majority of the time these are soft, sometimes hard pellets.  At the end of her bowel movements, there is sometimes bright red blood.  She sometimes has cramping with bowel movements, or before she experiences bleeding.   Colonoscopy was in 2023, virtual colonoscopy, no polyps or strictures.   04/18/24: MRI Normal postmenopausal appearance of uterus and ovaries. No evidence of pelvic mass or other significant abnormality.  05/10/24: Exam under anesthesia, attempted dilation of the cervix, attempted hysteroscopy, vaginal biopsy  ON vaginal exam, smooth vaginal mucosa.  Cervix difficult to palpate on vaginal exam.  On rectovaginal exam, small mobile uterus.  On speculum exam, moderate vaginal atrophy noted.  After application of acetic acid , no significant areas of acetowhite changes.  Biopsy taken at the vaginal apex.  There was what appeared to be a passage and possible cervical os at the right aspect of the vagina.  A second very small dimple was noted at the left vaginal apex.  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  Given no definitive track within the cervix and concern for possible unavoidable damage to the colon if I proceeded, decision made to abort procedure.   05/22/24: CT pelvis w rectal contrast No acute findings in the pelvis. No evidence for rectovaginal fistula.   Interval History: Doing well. Denies any vaginal bleeding.  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   Complication of anesthesia    per pt was very hypotensive w/ d&c hysteroscopy in 1990s in [place],  no issue the next surgery early 2000s don @[facility]    Depression    Frequency of urination    Hepatitis    History of chest pain    evaulation by cardiologist,  dr mona,  ekg normal, ETT 06-25-2015 normal (in epic),  atypical chest pain no further work-up   History of concussion    per pt has had few times , last time at age 29,  no residual   PMB (postmenopausal bleeding)    Thickened endometrium    Wears glasses     Past Surgical History:  Procedure Laterality Date   BREAST BIOPSY Right 10/27/2023   US  RT BREAST BX W LOC DEV 1ST LESION IMG BX SPEC US  GUIDE 10/27/2023 [facility]   CERVICAL POLYPECTOMY  early 2000s   dr jene @[facility]    COLONOSCOPY  2012 approx.   DIAGNOSTIC LAPAROSCOPY  1970s   for painful menses   DILATATION & CURETTAGE/HYSTEROSCOPY WITH MYOSURE N/A 03/17/2021   Procedure: DILATATION & CURETTAGE/HYSTEROSCOPY WITH MYOSURE;  Surgeon: Viktoria Comer SAUNDERS, MD;  Location: [facility];  Service: Gynecology;  Laterality: N/A;   DILATATION & CURETTAGE/HYSTEROSCOPY WITH MYOSURE N/A 01/19/2022   Procedure: DILATATION & CURETTAGE/HYSTEROSCOPY WITH MYOSURE;  Surgeon: Viktoria Comer SAUNDERS, MD;  Location: [facility];  Service: Gynecology;  Laterality: N/A;   DILATION AND CURETTAGE OF UTERUS  1970s   HYSTEROSCOPY WITH D & C  1990s   HYSTEROSCOPY WITH D & C Bilateral 05/10/2024   Procedure: EVALUATION UNDER ANESTHESIA, VAGINAL BIOPSIES, ATTEMPTED CERVICAL DILATION;  Surgeon: Viktoria Comer SAUNDERS, MD;  Location: [facility];  Service: Gynecology;  Laterality: Bilateral;  D AND C HYSTEROSCOPY WITH POSSIBLE CERVICAL BIOPSY AND POSSIBLE VAGINAL BIOPSY   NASAL FRACTURE SURGERY  age 20   OPERATIVE ULTRASOUND N/A 03/17/2021   Procedure: OPERATIVE ULTRASOUND;  Surgeon: Viktoria Comer SAUNDERS, MD;  Location: [facility];  Service:  Gynecology;  Laterality: N/A;   OPERATIVE ULTRASOUND N/A 01/19/2022   Procedure: POSSIBLE OPERATIVE ULTRASOUND GUIDANCE;  Surgeon: Viktoria Comer SAUNDERS, MD;  Location: [facility];  Service: Gynecology;  Laterality: N/A;   OPERATIVE ULTRASOUND N/A 05/10/2024   Procedure: US  INTRAOPERATIVE;  Surgeon: Viktoria Comer SAUNDERS, MD;  Location: [facility];  Service: Gynecology;  Laterality: N/A;    Family History  Problem Relation Age of Onset   Stroke Mother    Heart disease Mother        pacemaker   Heart attack Father    Kidney failure Father    Heart disease Brother        secondary cardiomyopathy, PVCs, atherosclerosis of native artery, syncope & collapse - sees cardiologist   Heart disease Maternal Grandmother  pacemaker   Heart attack Maternal Grandfather    Sudden death Maternal Grandfather    Cancer Paternal Grandmother        oral - tobacco   Heart attack Paternal Grandfather    Sudden death Paternal Grandfather    Breast cancer Niece    Colon cancer Neg Hx    Ovarian cancer Neg Hx    Endometrial cancer Neg Hx    Pancreatic cancer Neg Hx    Prostate cancer Neg Hx     Social History   Socioeconomic History   Marital status: Married    Spouse name: Not on file   Number of children: Not on file   Years of education: Not on file   Highest education level: Not on file  Occupational History   Occupation: Conservation officer, nature at grocery store  Tobacco Use   Smoking status: Never   Smokeless tobacco: Never  Vaping Use   Vaping status: Never Used  Substance and Sexual Activity   Alcohol use: No   Drug use: Never   Sexual activity: Not Currently    Birth control/protection: Post-menopausal  Other Topics Concern   Not on file  Social History Narrative   Not on file   Social Drivers of Health   Financial Resource Strain: Not on file  Food Insecurity: No Food Insecurity (04/05/2024)   Hunger Vital Sign    Worried About Running Out of Food in the Last Year: Never true     Ran Out of Food in the Last Year: Never true  Transportation Needs: Not on file  Physical Activity: Not on file  Stress: Not on file  Social Connections: Unknown (05/02/2022)   Received from [employer]   Social Network    Social Network: Not on file    Current Medications:  Current Outpatient Medications:    acetaminophen  (TYLENOL ) 500 MG tablet, Take 500-1,000 mg by mouth every 6 (six) hours as needed for mild pain, moderate pain, fever or headache., Disp: , Rfl:    aspirin-acetaminophen -caffeine (EXCEDRIN MIGRAINE) 250-250-65 MG tablet, Take 1 tablet by mouth daily as needed for headache., Disp: , Rfl:    B Complex-C (B-COMPLEX WITH VITAMIN C) tablet, Take 1 tablet by mouth daily., Disp: , Rfl:    diclofenac Sodium (VOLTAREN) 1 % GEL, Apply 1 Application topically 4 (four) times daily as needed (pain)., Disp: , Rfl:    estradiol  (ESTRACE  VAGINAL) 0.1 MG/GM vaginal cream, Place 1 Applicatorful vaginally 3 (three) times a week., Disp: 42.5 g, Rfl: 12   hydrocortisone 2.5 % cream, 1 application  daily. Apply to rectum, Disp: , Rfl:    Iron-Vitamin C 65-125 MG TABS, Take 1 tablet by mouth 4 (four) times a week., Disp: , Rfl:    Multiple Vitamins-Minerals (MULTIVITAMIN ADULT, MINERALS, PO), Take 1 tablet by mouth daily. [employer] Women's Multivitamin, Disp: , Rfl:    Naphazoline-Pheniramine (OPCON-A) 0.027-0.315 % SOLN, Place 1 drop into both eyes as needed (eye allergies)., Disp: , Rfl:    venlafaxine XR (EFFEXOR-XR) 37.5 MG 24 hr capsule, Take 37.5 mg by mouth at bedtime., Disp: , Rfl:    zinc gluconate 50 MG tablet, Take 50 mg by mouth 4 (four) times a week., Disp: , Rfl:   Review of Symptoms: Pertinent positives as per HPI.  Physical Exam: Deferred given limitations of phone visit.  Laboratory & Radiologic Studies: None new  Assessment & Plan: Mary Juarez is a 68 y.o. 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  MRI of the pelvis in April 2025 with normal postmenopausal appearance of the uterus and adnexa.  No endometrial lesions noted, no abnormal thickening of the endometrium. Concern for possible fistula between the vagina and sigmoid at the time of exam under anesthesia on 5/22.  Vaginal biopsy showed benign stroma, no mucosa. Follow-up CT of the pelvis with rectal contrast showed no definitive fistula.  The patient and I spent some time discussing her workup to date.  She met with a physician through her husband's work, who she says is affiliated with [facility].  Discussed with this person recommendations in terms of definitive surgery, paralleling what we have previously discussed.  Denies any recent bleeding.  We reviewed findings that initially prompted her most recent workup including possible cervical mass on office ultrasound and recurrent postmenopausal bleeding.  Both CT and MRI here, no cervical mass has been seen.  She has not had postmenopausal bleeding in months.  Her endometrium was normal in appearance on MRI.  At the time of her most recent procedure in May, I was concerned that there was a track at the upper right lateral vagina that may communicate with the colon.  Follow-up CT with rectal contrast did not show an obvious fistula.  We discussed options for definitive surgery again.  I would recommend if we were proceeding with definitive surgery that we proceed with total abdominal hysterectomy and bilateral salpingo-oophorectomy.  If the patient felt strongly about keeping her ovaries, as long as they were normal in appearance at the time of surgery and nothing on frozen section made the removal necessary, it would be reasonable to leave her ovaries in situ.  I discussed the plan again to send the  uterus for frozen section to ensure no pathology that would require additional procedures.  The patient would like to have a colorectal surgeon involved with her surgery.  I offered to reach out to Dr. Teresa, with whom I had spoken about the patient previously.  I discussed that I would typically set her surgery up on a day that he was available in the event that fistula was encountered.  She is very interested in having a sigmoidoscopy performed at the time of surgery regardless of intra-abdominal findings.  I we will send him a message to see if he would consider sigmoidoscopy (versus having her see GI to do this if OR findings don't necessitate).  The patient would like to move forward with getting a pelvic ultrasound to look again at the lining of her uterus and pelvic anatomy.  I will reach out to Dr. Teresa in the meantime.  We will revisit whether she would like to move forward with scheduling definitive surgery once we have her ultrasound results.  I discussed the assessment and treatment plan with the patient. The patient was provided with an opportunity to ask questions and all were answered. The patient agreed with the plan and demonstrated an understanding of the instructions.   The patient was advised to call back or see an in-person evaluation if the symptoms worsen or if the condition fails to improve as anticipated.   36 minutes of total time was spent for this patient encounter, including preparation, phone counseling with the patient and coordination of care, and documentation of the encounter.   Comer Dollar, MD  Division of Gynecologic  Oncology  Department of Obstetrics and Gynecology  [facility]

## 2024-07-27 ENCOUNTER — Inpatient Hospital Stay: Payer: Self-pay | Admitting: Gynecologic Oncology

## 2024-07-28 ENCOUNTER — Ambulatory Visit (HOSPITAL_COMMUNITY)
Admission: RE | Admit: 2024-07-28 | Discharge: 2024-07-28 | Disposition: A | Discharge status: Home / Self Care | Source: Ambulatory Visit | Attending: Gynecologic Oncology | Admitting: Gynecologic Oncology

## 2024-07-28 DIAGNOSIS — N952 Postmenopausal atrophic vaginitis: Secondary | ICD-10-CM | POA: Insufficient documentation

## 2024-07-28 DIAGNOSIS — R87622 Low grade squamous intraepithelial lesion on cytologic smear of vagina (LGSIL): Secondary | ICD-10-CM | POA: Diagnosis present

## 2024-07-28 DIAGNOSIS — N950 Postmenopausal bleeding: Secondary | ICD-10-CM | POA: Diagnosis present

## 2024-07-30 ENCOUNTER — Ambulatory Visit: Payer: Self-pay | Admitting: Gynecologic Oncology

## 2024-08-03 ENCOUNTER — Telehealth: Payer: Self-pay

## 2024-08-03 NOTE — Telephone Encounter (Signed)
 Ms.Colebank called office and lvm for me to return her call.   LVM advising her to read the mychart message from Dr.Tucker.

## 2024-08-03 NOTE — Telephone Encounter (Signed)
 Per Dr.Tucker, I LVM for Ms.Swango to return call to office regarding a follow up plan from recent telephone visit with Dr.Tucker.

## 2024-08-03 NOTE — Telephone Encounter (Signed)
 Ms.Hegg returned call.   1) She saw on the recent ultrasound from 8/9 it was recommended she get an MRI, she would like that ordered.   2) She is fine with a referral to [facility], she has been there before. But states she usually has to have a virtual colonoscopy.   3) She is also agreeable to having the testing in clinic with Dr.Tucker after the colonoscopy.   Aware we will send a referral to [facility], that office will call her and schedule appropriate colonoscopy. She will the call us  to schedule the office test once the colonoscopy has been done.   Message sent to Dr.Tucker

## 2024-08-06 ENCOUNTER — Other Ambulatory Visit: Payer: Self-pay | Admitting: Gynecologic Oncology

## 2024-08-06 DIAGNOSIS — N950 Postmenopausal bleeding: Secondary | ICD-10-CM

## 2024-08-06 DIAGNOSIS — R935 Abnormal findings on diagnostic imaging of other abdominal regions, including retroperitoneum: Secondary | ICD-10-CM

## 2024-08-06 NOTE — Telephone Encounter (Signed)
 [facility] referral sent for colonoscopy.

## 2024-08-06 NOTE — Progress Notes (Signed)
 See mychart message. Per Dr. Viktoria, can proceed with MR pelvis per pt request.

## 2024-08-07 ENCOUNTER — Telehealth: Payer: Self-pay

## 2024-08-07 NOTE — Telephone Encounter (Signed)
 Per Eleanor Epps NP, I LVM for Ms.Rueckert to call office regarding scheduling an MRI. What day/times work for her?

## 2024-08-08 NOTE — Telephone Encounter (Signed)
 2nd attempt to reach Mary Juarez regarding scheduling an MRI as recommended by radiologist.

## 2024-08-09 NOTE — Telephone Encounter (Signed)
 Pt is scheduled  for MRI on 8/30   [facility] for consultation on 9/9 at 10:00 to schedule colonoscopy

## 2024-08-09 NOTE — Telephone Encounter (Signed)
 Ms. Arai returned call, she is fine with MRI being scheduled but wants it on a weekend if at all possible.   I called [facility] to check on the status of the referral for colonoscopy from Dr.Tucker, per Chander, she will call patient today and get the appointment started.   Once colonoscopy has been done pt will call and schedule an in office test with Dr.Tucker

## 2024-08-09 NOTE — Telephone Encounter (Signed)
 MRI scheduled for 8/30 @ 10:00 with 9:30 arrival time.

## 2024-08-15 DIAGNOSIS — H6501 Acute serous otitis media, right ear: Secondary | ICD-10-CM | POA: Diagnosis not present

## 2024-08-15 DIAGNOSIS — H60541 Acute eczematoid otitis externa, right ear: Secondary | ICD-10-CM | POA: Diagnosis not present

## 2024-08-18 ENCOUNTER — Ambulatory Visit (HOSPITAL_COMMUNITY)
Admission: RE | Admit: 2024-08-18 | Discharge: 2024-08-18 | Disposition: A | Discharge status: Home / Self Care | Source: Ambulatory Visit | Attending: Gynecologic Oncology | Admitting: Gynecologic Oncology

## 2024-08-18 DIAGNOSIS — R935 Abnormal findings on diagnostic imaging of other abdominal regions, including retroperitoneum: Secondary | ICD-10-CM | POA: Insufficient documentation

## 2024-08-18 DIAGNOSIS — N950 Postmenopausal bleeding: Secondary | ICD-10-CM | POA: Diagnosis not present

## 2024-08-18 MED ORDER — GADOBUTROL 1 MMOL/ML IV SOLN
6.0000 mL | Freq: Once | INTRAVENOUS | Status: AC | PRN
  Administered 2024-08-18: 6 mL via INTRAVENOUS

## 2024-08-22 ENCOUNTER — Ambulatory Visit: Payer: Self-pay | Admitting: Gynecologic Oncology

## 2024-09-03 ENCOUNTER — Telehealth: Payer: Self-pay

## 2024-09-03 NOTE — Telephone Encounter (Signed)
 Office notes received from appointment at [facility] on 08/28/24.  Notes given to Dr.Tucker for review.

## 2024-09-06 ENCOUNTER — Telehealth: Payer: Self-pay

## 2024-09-06 NOTE — Telephone Encounter (Signed)
 She has been worked up for this in the last few years and testing was negative

## 2024-09-06 NOTE — Telephone Encounter (Signed)
 I reached out to [facility] regarding pt's appointment with Mary Rad PA on 9/9. Office notes received from that visit stating they would check with Mary Juarez for virtual vs.under anesthesia colonoscopy.   Mary Juarez, states the colonoscopy will be under anesthesia instead of a virtual. It has not been scheduled as of yet d/t they are reaching out to the provider who manages pt's Von Willebrand medications for recommendations for surgery.  Mary Juarez will call our office once colonoscopy is scheduled.   Office notes placed for Mary Juarez to review, (MRI done 8/30) message sent to her as well.

## 2024-09-12 ENCOUNTER — Telehealth: Payer: Self-pay | Admitting: *Deleted

## 2024-09-12 NOTE — Telephone Encounter (Signed)
 Thank you - do you want to reach out to her about getting a visit scheduled for the blue dye test sometime after?

## 2024-09-12 NOTE — Telephone Encounter (Signed)
 Received a call from Anika from [facility] that they have Ms. Labell scheduled for a colonoscopy on November 3rd. If we have any further questions she can be reached at [phone].

## 2024-09-13 NOTE — Telephone Encounter (Signed)
 Can go on Melissa's schedule at 8 or 8:15 - I assume Sowder wanted a pre-work appointment

## 2024-09-13 NOTE — Telephone Encounter (Signed)
 Mary Juarez is scheduled with Dr.tucker on 11/14 @ 8:00. Pt agrees to date/time

## 2024-10-11 ENCOUNTER — Telehealth: Payer: Self-pay | Admitting: *Deleted

## 2024-10-11 NOTE — Telephone Encounter (Signed)
 Thank you :)

## 2024-10-11 NOTE — Telephone Encounter (Signed)
 Returned the patient's call and patient canceled appt for 11/14. Patient stated that I was to have the colonoscopy first with Dr Lawernce. But I am here in [place]  and not sure when I will be back. Once I'm back I will call and reschedule.    Appt canceled and provider notified

## 2024-10-31 ENCOUNTER — Encounter

## 2024-10-31 ENCOUNTER — Other Ambulatory Visit

## 2024-11-02 ENCOUNTER — Ambulatory Visit: Admitting: Gynecologic Oncology
